# Patient Record
Sex: Female | Born: 1960 | Race: White | Hispanic: No | Marital: Married | Smoking: Never smoker
Health system: Southern US, Community
[De-identification: ages and names within clinical notes are randomized; demographics above are authoritative.]

## PROBLEM LIST (undated history)

## (undated) DIAGNOSIS — N390 Urinary tract infection, site not specified: Secondary | ICD-10-CM

## (undated) DIAGNOSIS — M1990 Unspecified osteoarthritis, unspecified site: Secondary | ICD-10-CM

## (undated) DIAGNOSIS — F419 Anxiety disorder, unspecified: Secondary | ICD-10-CM

## (undated) HISTORY — DX: Anxiety disorder, unspecified: F41.9

## (undated) HISTORY — DX: Unspecified osteoarthritis, unspecified site: M19.90

## (undated) HISTORY — DX: Urinary tract infection, site not specified: N39.0

---

## 1986-09-15 HISTORY — PX: TONSILLECTOMY: SUR1361

## 2001-06-02 ENCOUNTER — Other Ambulatory Visit: Admission: RE | Admit: 2001-06-02 | Discharge: 2001-06-02 | Payer: Self-pay | Admitting: Obstetrics and Gynecology

## 2002-06-13 ENCOUNTER — Other Ambulatory Visit: Admission: RE | Admit: 2002-06-13 | Discharge: 2002-06-13 | Payer: Self-pay | Admitting: Obstetrics and Gynecology

## 2003-06-27 ENCOUNTER — Other Ambulatory Visit: Admission: RE | Admit: 2003-06-27 | Discharge: 2003-06-27 | Payer: Self-pay | Admitting: Obstetrics and Gynecology

## 2004-06-28 ENCOUNTER — Other Ambulatory Visit: Admission: RE | Admit: 2004-06-28 | Discharge: 2004-06-28 | Payer: Self-pay | Admitting: Obstetrics and Gynecology

## 2004-09-15 DIAGNOSIS — M1990 Unspecified osteoarthritis, unspecified site: Secondary | ICD-10-CM

## 2004-09-15 HISTORY — DX: Unspecified osteoarthritis, unspecified site: M19.90

## 2005-07-04 ENCOUNTER — Other Ambulatory Visit: Admission: RE | Admit: 2005-07-04 | Discharge: 2005-07-04 | Payer: Self-pay | Admitting: Obstetrics and Gynecology

## 2005-07-17 ENCOUNTER — Encounter: Admission: RE | Admit: 2005-07-17 | Discharge: 2005-07-17 | Payer: Self-pay | Admitting: Family Medicine

## 2006-08-14 ENCOUNTER — Other Ambulatory Visit: Admission: RE | Admit: 2006-08-14 | Discharge: 2006-08-14 | Payer: Self-pay | Admitting: Obstetrics and Gynecology

## 2006-10-19 IMAGING — CR DG LUMBAR SPINE COMPLETE 4+V
5 series · 5 of 5 positions shown · non-contrast
Comparison: none

CLINICAL DATA: Low back pain.  Right hip pain. 
 LUMBAR SPINE ? 4 VIEW:

[t l-spine a.p.]
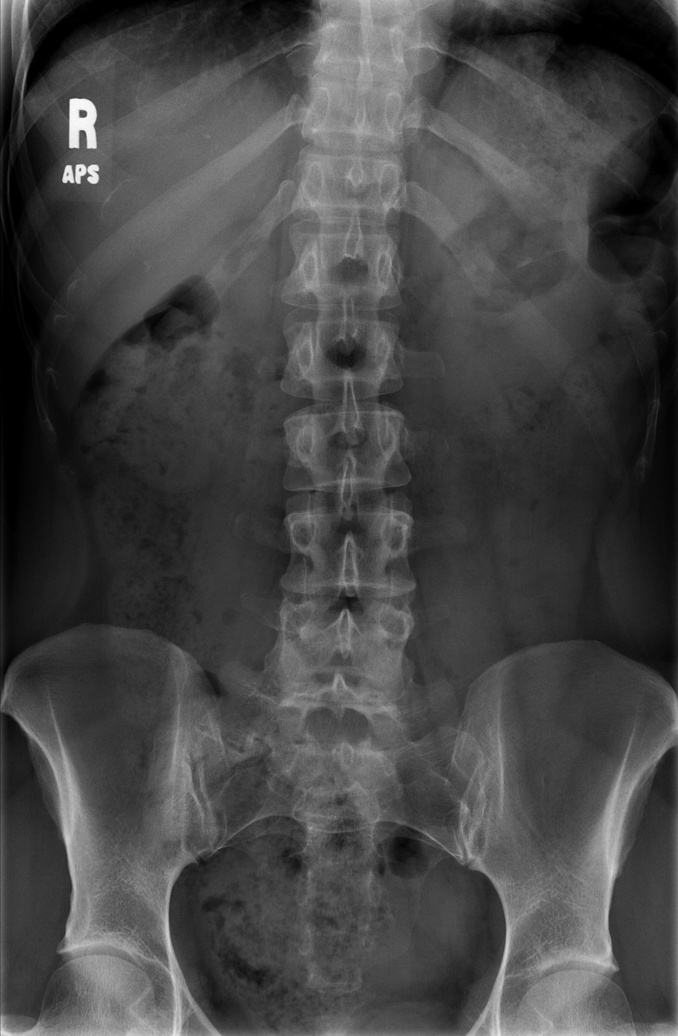

[t l-spine oblique exposure (1 of 2)]
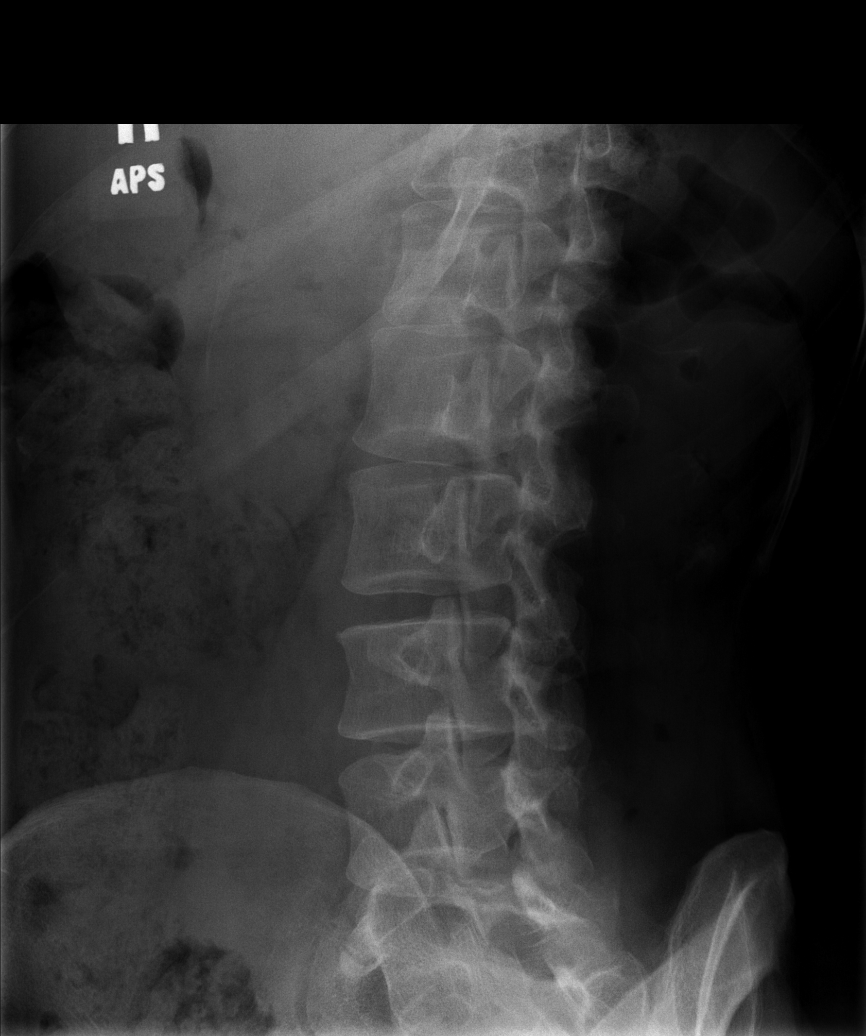

[t l-spine oblique exposure (2 of 2)]
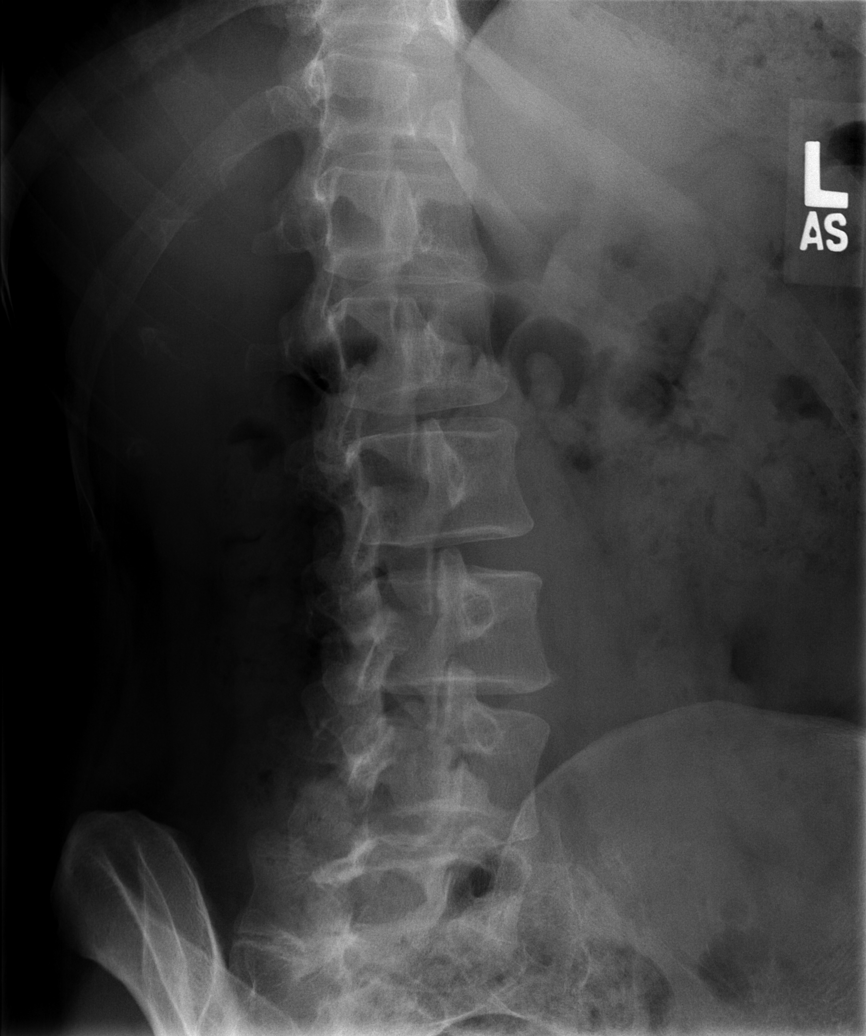

[t l-spine lat]
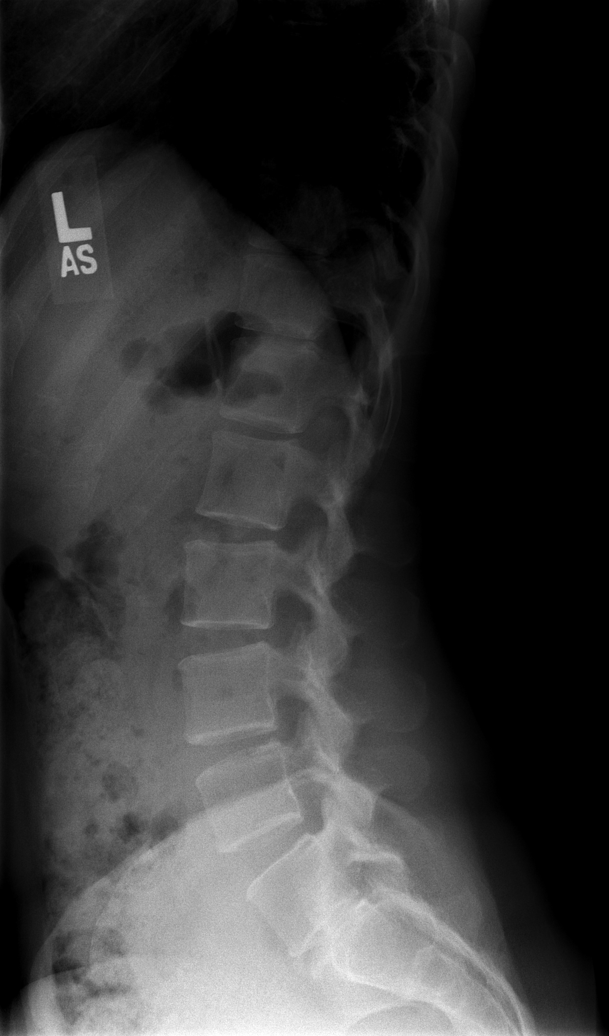

[t l-spine l5-s1 spot]
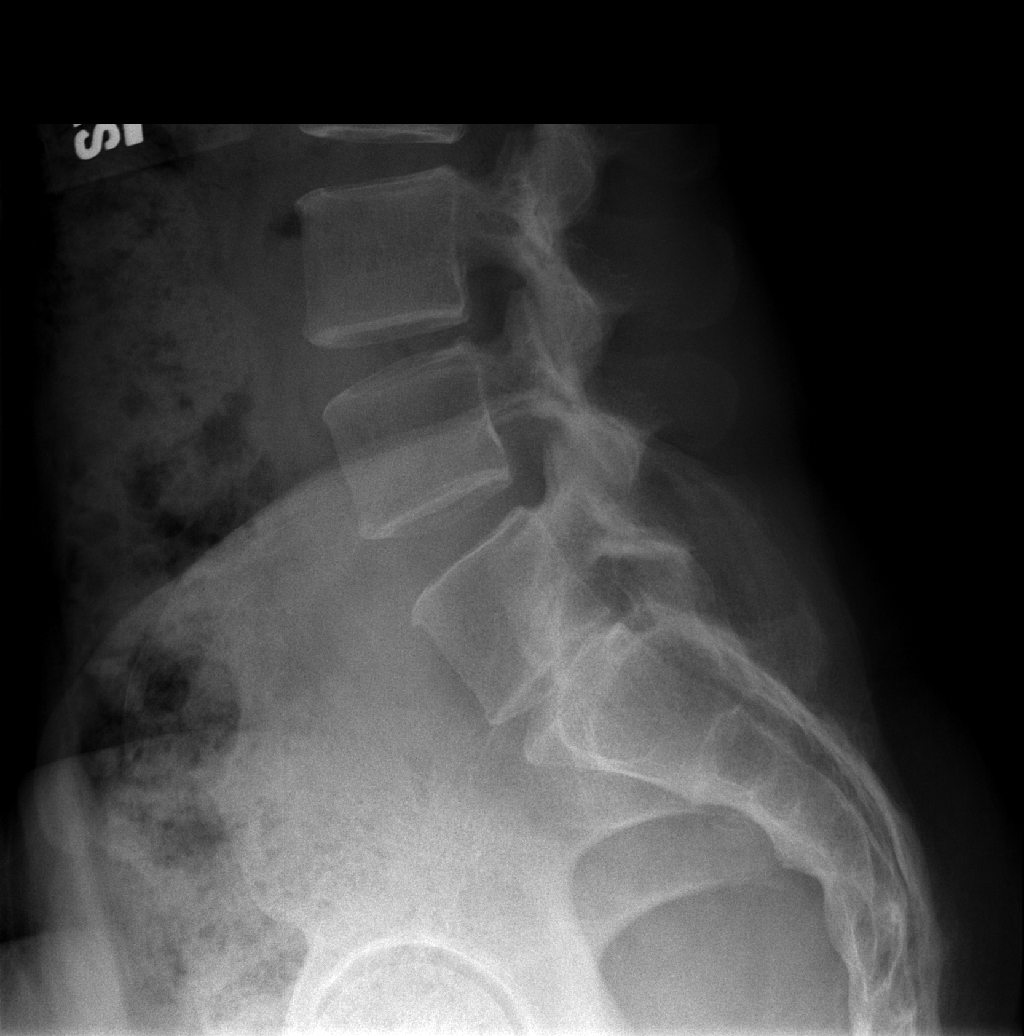

[5 of 5 positions shown; findings below may reference images not displayed]

FINDINGS: The patient has transitional anatomy.  There are six non-rib bearing-type vertebral bodies, the lowest of which is transitional.  I will describe this lowest vertebral body as L-5 but rib films would be necessary to know the actual anatomic number.  There is mild thoracolumbar scoliosis to the right and lower lumbar scoliosis to the left.  There is minimal disc space narrowing at L3-4.  The L4-5 disc space height is normal.  The L5-S1 disc space is narrowed.  This could be due to degenerative disc disease or due to the transitional nature of the anatomy.  There is facet degeneration of a mild degree at L3-4 and L4-5.  The patient has an anomalous articulation between L-5 and the sacrum on the right.  There is sacroiliac joint degenerative disease on the right.
IMPRESSION: 1.  Mild scoliosis of the spine. 
 2.  Transitional anatomy in the lumbar region.  There is slight disc space narrowing at L3-4 and possibly at L5-S1.  There is facet degeneration at L3-4 and L4-5 but no slippage.  There is degeneration of an anomalous articulation between L-5 and the sacrum and there is degeneration of the SI joint on the right.

## 2007-11-19 ENCOUNTER — Other Ambulatory Visit: Admission: RE | Admit: 2007-11-19 | Discharge: 2007-11-19 | Payer: Self-pay | Admitting: Obstetrics and Gynecology

## 2008-11-22 ENCOUNTER — Other Ambulatory Visit: Admission: RE | Admit: 2008-11-22 | Discharge: 2008-11-22 | Payer: Self-pay | Admitting: Obstetrics and Gynecology

## 2009-08-13 ENCOUNTER — Encounter: Admission: RE | Admit: 2009-08-13 | Discharge: 2009-08-13 | Payer: Self-pay | Admitting: Gastroenterology

## 2013-01-18 ENCOUNTER — Encounter: Payer: Self-pay | Admitting: Obstetrics and Gynecology

## 2013-01-18 ENCOUNTER — Encounter: Payer: Self-pay | Admitting: *Deleted

## 2013-01-18 ENCOUNTER — Ambulatory Visit (INDEPENDENT_AMBULATORY_CARE_PROVIDER_SITE_OTHER): Payer: BC Managed Care – PPO | Admitting: Obstetrics and Gynecology

## 2013-01-18 ENCOUNTER — Other Ambulatory Visit: Payer: Self-pay | Admitting: Obstetrics and Gynecology

## 2013-01-18 VITALS — BP 100/72 | Wt 138.0 lb

## 2013-01-18 DIAGNOSIS — F411 Generalized anxiety disorder: Secondary | ICD-10-CM

## 2013-01-18 MED ORDER — CLONAZEPAM 0.5 MG PO TABS: 0.5000 mg | ORAL_TABLET | Freq: Two times a day (BID) | ORAL | Status: DC | PRN

## 2013-01-18 MED ORDER — CITALOPRAM HYDROBROMIDE 20 MG PO TABS: 20.0000 mg | ORAL_TABLET | Freq: Every day | ORAL | Status: DC

## 2013-01-18 NOTE — Telephone Encounter (Signed)
Spoke with pt who was treated by Dr. Tresa Res 1 1/2 to 2 years ago for anxiety. She started taking 2 anxiety meds (cannot remember the names) for about a year, and then weaned off of them about a year ago when she was doing better. Now pt feels the anxiety is coming back. Pt nervous, shaky, not sleeping well, decreased appetite, has sweaty hands and feet. Pt reports she is going through some stress because her mom's health is bad. Pt also getting ready to take her daughter to [place] for 3 months for an internship. Pt wondering if Dr. Tresa Res would consider restarting the same meds again. Advised pt that Dr. Tresa Res would most likely want to see her again since it has been a while. Pt willing to come in today at 1:45 if necessary, but wanted to see what Dr. Tresa Res had to say. Please advise.

## 2013-01-18 NOTE — Telephone Encounter (Signed)
Patient is calling about a refill, patient says she is having some symptoms (no further details were given)

## 2013-01-18 NOTE — Telephone Encounter (Signed)
Rec ov

## 2013-01-18 NOTE — Progress Notes (Signed)
52 yo MWF G1P1 comes in today requesting therapy for her anxiety.  Pt has been treated for anxiety in the past with citalopram 20 mg qd and prn clonazepam and after AnEx last June, decided to wean off of these because she felt the stressors that caused her anxiety were resolved.  She originally started the citalopram and cloazepam in May 2012.  Also, in 2004 she was having problems with irritability and crying easily and I treated her with Lexapro 10 mg but she used it for about 6 weeks and stopped it because she preferred to handle it "spiritually".  Now patient is experiencing social stressors again with her mom's health being bad, and with her daughter getting ready to leave for an internship in [place].  Pt wants to go back on medication.  Pt not sleeping well, tearful, has cold clammy hands, difficulty concentrating, decreased appetite, feels shaky.  Pt would like to go back on citalopram.  Also will want clonaz for immediate relief.

## 2013-01-18 NOTE — Telephone Encounter (Signed)
Spoke with Lauren Proctor to advise Dr. Tresa Res would like to see her. Lauren Proctor has appt today at 1:45.

## 2013-01-18 NOTE — Addendum Note (Signed)
Addended by: Alison Murray on: 01/18/2013 02:19 PM   Modules accepted: Orders

## 2013-01-18 NOTE — Patient Instructions (Signed)
Begin citalopram daily, and use clonazepam if needed for anxiety attacks.  I'll see you again in June for your check up.  Hang in there!

## 2013-02-22 ENCOUNTER — Encounter: Payer: Self-pay | Admitting: Gynecology

## 2013-02-25 ENCOUNTER — Encounter: Payer: Self-pay | Admitting: Gynecology

## 2013-02-25 ENCOUNTER — Ambulatory Visit (INDEPENDENT_AMBULATORY_CARE_PROVIDER_SITE_OTHER): Payer: BC Managed Care – PPO | Admitting: Gynecology

## 2013-02-25 ENCOUNTER — Ambulatory Visit: Payer: Self-pay | Admitting: Obstetrics and Gynecology

## 2013-02-25 VITALS — BP 108/68 | HR 82 | Resp 16 | Ht 64.75 in | Wt 134.0 lb

## 2013-02-25 DIAGNOSIS — Z01419 Encounter for gynecological examination (general) (routine) without abnormal findings: Secondary | ICD-10-CM

## 2013-02-25 DIAGNOSIS — F411 Generalized anxiety disorder: Secondary | ICD-10-CM | POA: Insufficient documentation

## 2013-02-25 DIAGNOSIS — Z0000 Encounter for general adult medical examination without abnormal findings: Secondary | ICD-10-CM

## 2013-02-25 LAB — HEMOGLOBIN, FINGERSTICK: Hemoglobin, fingerstick: 13.2 g/dL (ref 12.0–16.0)

## 2013-02-25 MED ORDER — CLONAZEPAM 0.5 MG PO TABS: 0.5000 mg | ORAL_TABLET | Freq: Two times a day (BID) | ORAL | Status: DC | PRN

## 2013-02-25 NOTE — Patient Instructions (Signed)

## 2013-02-25 NOTE — Progress Notes (Signed)
52 y.o. Married Caucasian female   G1P1 here for annual exam. Pt reports menses are regular.  She does not report hot flashes, does not have night sweats, does have vaginal dryness.  She is not using lubricants,.  She does not report post-menopasual bleeding Pt restarted on celexa 59month ago for stress and reports is  feeling better; but her sleep is not.  Would like to continue clonazepan needs refill.  LMP: 02/23/13        Sexually active: yes  The current method of family planning is condoms.    Exercising: yes  Zumba 2X/wk; walking 4x/wk  Last pap: 02/20/12 NEG HR HPV Abnormal PAP:  Once, had cross contaminiation? Mammogram: 12/31/12  BSE: yes Colonoscopy: none DEXA: none Alcohol: no Tobacco: no  Hgb: 13.2 Urine:  Normal  Health Maintenance  Topic Date Due  . Colonoscopy  08/17/2012  . Influenza Vaccine  05/16/2013  . Mammogram  01/01/2015  . Pap Smear  02/20/2015  . Tetanus/tdap  11/18/2017    No family history on file.  There are no active problems to display for this patient.   Past Medical History  Diagnosis Date  . Anxiety     Over pelvic exams  . 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 Cesarean section  1992  . Tonsillectomy  1988    Allergies: Review of patient's allergies indicates no known allergies.  Current Outpatient Prescriptions  Medication Sig Dispense Refill  . Acetaminophen (TYLENOL PO) Take by mouth as needed.      . Calcium-Vitamin D-Vitamin K (VIACTIV) 500-500-40 MG-UNT-MCG CHEW Chew by mouth.      . Cholecalciferol (VITAMIN D PO) Take by mouth every 30 (thirty) days.       . citalopram (CELEXA) 20 MG tablet Take 1 tablet (20 mg total) by mouth daily.  30 tablet  12  . clonazePAM (KLONOPIN) 0.5 MG tablet Take 1 tablet (0.5 mg total) by mouth 2 (two) times daily as needed for anxiety.  30 tablet  1  . Multiple Vitamins-Minerals (MULTIVITAMIN PO) Take by mouth.      . Naproxen Sodium (ALEVE PO) Take  by mouth as needed.      . vitamin C (ASCORBIC ACID) 500 MG tablet Take 500 mg by mouth daily.       No current facility-administered medications for this visit.    ROS: Pertinent items are noted in HPI.  Exam:    There were no vitals taken for this visit. Weight change: @WEIGHTCHANGE @ Last 3 height recordings:  Ht Readings from Last 3 Encounters:  No data found for Ht   General appearance: alert, cooperative and appears stated age Head: Normocephalic, without obvious abnormality, atraumatic Neck: no adenopathy, no carotid bruit, no JVD, supple, symmetrical, trachea midline and thyroid not enlarged, symmetric, no tenderness/mass/nodules Lungs: clear to auscultation bilaterally Breasts: normal appearance, no masses or tenderness Heart: regular rate and rhythm, S1, S2 normal, no murmur, click, rub or gallop Abdomen: soft, non-tender; bowel sounds normal; no masses,  no organomegaly Extremities: extremities normal, atraumatic, no cyanosis or edema Skin: Skin color, texture, turgor normal. No rashes or lesions Lymph nodes: Cervical, supraclavicular, and axillary nodes normal. no inguinal nodes palpated Neurologic: Grossly normal   Pelvic: External genitalia:  no lesions              Urethra: normal appearing urethra with no masses, tenderness or lesions  Bartholins and Skenes: normal                 Vagina: normal appearing vagina with normal color and discharge, no lesions              Cervix: normal appearance              Pap taken: no        Bimanual Exam:  Uterus:  uterus is normal size, shape, consistency and nontender, retroverted                                      Adnexa:    no masses                                      Rectovaginal: Confirms                                      Anus:  normal sphincter tone, no lesions  A: well woman      P: mammogram qyr pap smear 2018 colonoscopy [facility]ine Refill clonazepam-aware habit foming Check Vit D  level return annually or prn Discussed PAP guideline changes, importance of weight bearing exercises, calcium, vit D and balanced diet.  An After Visit Summary was printed and given to the patient.

## 2013-02-28 ENCOUNTER — Telehealth: Payer: Self-pay | Admitting: *Deleted

## 2013-02-28 NOTE — Telephone Encounter (Signed)
Message copied by Lorraine Lax on Mon Feb 28, 2013 10:46 AM ------      Message from: Douglass Rivers      Created: Mon Feb 28, 2013  6:39 AM       Vit d 1, review protocol ------

## 2013-02-28 NOTE — Telephone Encounter (Signed)
Left Message To Call Back  

## 2013-02-28 NOTE — Telephone Encounter (Signed)
Pt notified see lab (vit d)

## 2013-06-21 ENCOUNTER — Encounter: Payer: Self-pay | Admitting: Certified Nurse Midwife

## 2013-06-21 ENCOUNTER — Ambulatory Visit (INDEPENDENT_AMBULATORY_CARE_PROVIDER_SITE_OTHER): Payer: BC Managed Care – PPO | Admitting: Certified Nurse Midwife

## 2013-06-21 VITALS — BP 104/64 | HR 64 | Resp 16 | Ht 64.75 in | Wt 135.0 lb

## 2013-06-21 DIAGNOSIS — B373 Candidiasis of vulva and vagina: Secondary | ICD-10-CM

## 2013-06-21 DIAGNOSIS — N390 Urinary tract infection, site not specified: Secondary | ICD-10-CM

## 2013-06-21 LAB — POCT URINALYSIS DIPSTICK
Bilirubin, UA: NEGATIVE
Blood, UA: NEGATIVE
Glucose, UA: NEGATIVE
Ketones, UA: NEGATIVE
Leukocytes, UA: NEGATIVE
Nitrite, UA: NEGATIVE
Protein, UA: NEGATIVE
Urobilinogen, UA: NEGATIVE
pH, UA: 8

## 2013-06-21 MED ORDER — PHENAZOPYRIDINE HCL 200 MG PO TABS: 200.0000 mg | ORAL_TABLET | Freq: Three times a day (TID) | ORAL | Status: DC | PRN

## 2013-06-21 MED ORDER — FLUCONAZOLE 150 MG PO TABS: 150.0000 mg | ORAL_TABLET | Freq: Once | ORAL | Status: DC

## 2013-06-21 MED ORDER — NITROFURANTOIN MONOHYD MACRO 100 MG PO CAPS: 100.0000 mg | ORAL_CAPSULE | Freq: Two times a day (BID) | ORAL | Status: DC

## 2013-06-21 NOTE — Progress Notes (Signed)
Note reviewed, agree with plan.  Janalee Grobe, MD  

## 2013-06-21 NOTE — Progress Notes (Signed)
51 y.o.MarriedCaucasian female G1P1 with a 3 day(s) history of the following:discharge described as clear with itching and burning . Sexually active: yes Last sexual activity:6days ago. Pt also reports the following associated symptoms: dysuria, urinary frequency and urinary urgency Patient has tried over the counter treatment with minimal relief. No new personal products.Patient denies fever, chills or headache.  O: Healthy female WDWN Skin warm and dry Abdomen: positive suprapubic CVAT negative bilateral    Exam:  ZOX:WRUEAV, no lesions,   Urethra, urethral meatus slightly red, tender, bladder tender                Vag:no lesions, discharge: copious, white and thick, pH 4.0, wet prep done                Cx:  normal appearance and non tender                Uterus:normal size, non-tender, normal shape and consistency                Adnexa: normal adnexa and no mass, fullness, tenderness  Wet Prep shows: positive for yeast, negative for BV, trich Poct urine-neg, ph 8.0  A: Symptomatic UTI with urethritis Yeast vaginitis  P: Reviewed findings and need to increase water daily Rx Macrobid see order Rx Pyridium See order [id number] culture Reviewed findings, encourage Aveeno sitz bath for comfort. Rx Diflucan see order :Symptomatic local care discussed.  Rv prn, TOC if indicated

## 2013-06-23 ENCOUNTER — Other Ambulatory Visit: Payer: Self-pay | Admitting: Certified Nurse Midwife

## 2013-06-23 ENCOUNTER — Telehealth: Payer: Self-pay

## 2013-06-23 DIAGNOSIS — N390 Urinary tract infection, site not specified: Secondary | ICD-10-CM

## 2013-06-23 LAB — URINE CULTURE: Colony Count: 40000

## 2013-06-23 NOTE — Telephone Encounter (Signed)
Patient notified

## 2013-06-23 NOTE — Telephone Encounter (Signed)
lmtcb

## 2013-06-23 NOTE — Telephone Encounter (Signed)
Message copied by Eliezer Bottom on Thu Jun 23, 2013  9:39 AM ------      Message from: Verner Chol      Created: Thu Jun 23, 2013  9:04 AM       Notify culture showed positive for Klebsiella in urine      Needs 2 week TOC only      Status ------

## 2013-07-07 ENCOUNTER — Telehealth: Payer: Self-pay | Admitting: Certified Nurse Midwife

## 2013-07-07 ENCOUNTER — Ambulatory Visit: Payer: BC Managed Care – PPO | Admitting: *Deleted

## 2013-07-07 NOTE — Telephone Encounter (Signed)
agree

## 2013-07-07 NOTE — Telephone Encounter (Signed)
Spoke with patient. She is having cold/flu symptoms and requested rescheduled for TOC. Appt scheduled.   Routing to provider for final review. Patient agreeable to disposition. Will close encounter

## 2013-07-07 NOTE — Telephone Encounter (Signed)
Patient had a toc urine this morning at 10:30 but wanted to cancel due to feeling sick. She said she feels ok in that area but still wanted to come in but couldn't come in until next Tuesday or thrusday i wasn't sure if it was ok for her to wait that long.

## 2013-07-12 ENCOUNTER — Ambulatory Visit (INDEPENDENT_AMBULATORY_CARE_PROVIDER_SITE_OTHER): Payer: BC Managed Care – PPO | Admitting: *Deleted

## 2013-07-12 DIAGNOSIS — N390 Urinary tract infection, site not specified: Secondary | ICD-10-CM

## 2013-07-13 LAB — URINE CULTURE
Colony Count: NO GROWTH
Organism ID, Bacteria: NO GROWTH

## 2013-07-14 ENCOUNTER — Telehealth: Payer: Self-pay | Admitting: *Deleted

## 2013-07-14 NOTE — Telephone Encounter (Signed)
Patient is returning [place] call

## 2013-07-14 NOTE — Telephone Encounter (Signed)
Message copied by Lorraine Lax on Thu Jul 14, 2013 10:06 AM ------      Message from: Verner Chol      Created: Thu Jul 14, 2013  8:15 AM       Notify urine culture negative, no further treatment needed. ------

## 2013-07-14 NOTE — Telephone Encounter (Signed)
Left Message To Call Back  

## 2013-07-19 NOTE — Telephone Encounter (Signed)
Patient notified see labs 

## 2013-10-31 ENCOUNTER — Encounter: Payer: Self-pay | Admitting: Certified Nurse Midwife

## 2013-10-31 ENCOUNTER — Ambulatory Visit (INDEPENDENT_AMBULATORY_CARE_PROVIDER_SITE_OTHER): Payer: BC Managed Care – PPO | Admitting: Certified Nurse Midwife

## 2013-10-31 VITALS — BP 98/67 | HR 81 | Temp 98.1°F | Resp 16 | Ht 64.75 in | Wt 137.0 lb

## 2013-10-31 DIAGNOSIS — B373 Candidiasis of vulva and vagina: Secondary | ICD-10-CM

## 2013-10-31 DIAGNOSIS — B3731 Acute candidiasis of vulva and vagina: Secondary | ICD-10-CM

## 2013-10-31 DIAGNOSIS — N390 Urinary tract infection, site not specified: Secondary | ICD-10-CM

## 2013-10-31 LAB — POCT URINALYSIS DIPSTICK
Bilirubin, UA: NEGATIVE
Glucose, UA: NEGATIVE
Ketones, UA: NEGATIVE
Leukocytes, UA: NEGATIVE
Nitrite, UA: NEGATIVE
Protein, UA: NEGATIVE
RBC UA: NEGATIVE
Urobilinogen, UA: NEGATIVE
pH, UA: 5

## 2013-10-31 MED ORDER — NITROFURANTOIN MONOHYD MACRO 100 MG PO CAPS: 100.0000 mg | ORAL_CAPSULE | Freq: Two times a day (BID) | ORAL | Status: DC

## 2013-10-31 MED ORDER — FLUCONAZOLE 100 MG PO TABS: ORAL_TABLET | ORAL | Status: DC

## 2013-10-31 NOTE — Progress Notes (Signed)
Reviewed personally.  M. Suzanne Gianah Batt, MD.  

## 2013-10-31 NOTE — Progress Notes (Signed)
53 y.o.MarriedCaucasian female G1P1 with a 4 day(s) history of the following:local irritation and urinary symptoms of urinary frequency and urinary urgency Sexually active:yes Last sexual activity:14 days ago. Pt also reports the following associated symptoms: none. Patient has not tried over the counter treatment..Patient recently treated with antibiotics for sinus infection one week ago.  O:Healthy WDWN female Affect: normal, non tender Skin: warm and dry CVAT: Negative  Bilateral     Exam:  AVW:[id number]:normal, no lesions  Bladder, urethra tender, urethral meatus slightly red, not tender                Vag:no lesions, discharge: white, thick and curd-like Ph 4.0, wet prep taken                Cx:  normal appearance                Uterus:normal size, non-tender, normal shape and consistency                Adnexa: normal adnexa and no mass, fullness, tenderness  Wet Prep shows:positive for yeast  Poct urine-neg  A:UTI Yeast vaginitis  P: Reviewed findings and recommend treatment. Rx Macrobid with instructions see order Reviewed yeast findings and maybe a result of antibiotic use. Rx Diflucan see order. Encouraged oral probiotic use to help with immune status response with antibiotic use.  Lab: Urine culture  Symptomatic local care discussed. Transport plannerducational materials distributed.  Rv prn

## 2013-10-31 NOTE — Patient Instructions (Signed)
Urinary Tract Infection  Urinary tract infections (UTIs) can develop anywhere along your urinary tract. Your urinary tract is your body's drainage system for removing wastes and extra water. Your urinary tract includes two kidneys, two ureters, a bladder, and a urethra. Your kidneys are a pair of bean-shaped organs. Each kidney is about the size of your fist. They are located below your ribs, one on each side of your spine.  CAUSES  Infections are caused by microbes, which are microscopic organisms, including fungi, viruses, and bacteria. These organisms are so small that they can only be seen through a microscope. Bacteria are the microbes that most commonly cause UTIs.  SYMPTOMS   Symptoms of UTIs may vary by age and gender of the patient and by the location of the infection. Symptoms in young women typically include a frequent and intense urge to urinate and a painful, burning feeling in the bladder or urethra during urination. Older women and men are more likely to be tired, shaky, and weak and have muscle aches and abdominal pain. A fever may mean the infection is in your kidneys. Other symptoms of a kidney infection include pain in your back or sides below the ribs, nausea, and vomiting.  DIAGNOSIS  To diagnose a UTI, your caregiver will ask you about your symptoms. Your caregiver also will ask to provide a urine sample. The urine sample will be tested for bacteria and white blood cells. White blood cells are made by your body to help fight infection.  TREATMENT   Typically, UTIs can be treated with medication. Because most UTIs are caused by a bacterial infection, they usually can be treated with the use of antibiotics. The choice of antibiotic and length of treatment depend on your symptoms and the type of bacteria causing your infection.  HOME CARE INSTRUCTIONS   If you were prescribed antibiotics, take them exactly as your caregiver instructs you. Finish the medication even if you feel better after you  have only taken some of the medication.   Drink enough water and fluids to keep your urine clear or pale yellow.   Avoid caffeine, tea, and carbonated beverages. They tend to irritate your bladder.   Empty your bladder often. Avoid holding urine for long periods of time.   Empty your bladder before and after sexual intercourse.   After a bowel movement, women should cleanse from front to back. Use each tissue only once.  SEEK MEDICAL CARE IF:    You have back pain.   You develop a fever.   Your symptoms do not begin to resolve within 3 days.  SEEK IMMEDIATE MEDICAL CARE IF:    You have severe back pain or lower abdominal pain.   You develop chills.   You have nausea or vomiting.   You have continued burning or discomfort with urination.  MAKE SURE YOU:    Understand these instructions.   Will watch your condition.   Will get help right away if you are not doing well or get worse.  Document Released: 06/11/2005 Document Revised: 03/02/2012 Document Reviewed: 10/10/2011  ExitCare Patient Information 2014 [employer].

## 2013-11-02 ENCOUNTER — Telehealth: Payer: Self-pay

## 2013-11-02 LAB — URINE CULTURE: Colony Count: 6000

## 2013-11-02 NOTE — Telephone Encounter (Signed)
Message copied by Eliezer BottomJOHNSON, Rebekka Lobello J on Wed Nov 02, 2013 12:22 PM ------      Message from: Verner CholLEONARD, DEBORAH S      Created: Wed Nov 02, 2013 10:55 AM       Notify patient that urine culture negative      Patient status ------

## 2013-11-02 NOTE — Telephone Encounter (Signed)
Patient notified of results.

## 2013-11-02 NOTE — Telephone Encounter (Signed)
lmtcb

## 2014-02-27 ENCOUNTER — Ambulatory Visit: Payer: BC Managed Care – PPO | Admitting: Gynecology

## 2014-02-28 ENCOUNTER — Ambulatory Visit: Payer: BC Managed Care – PPO | Admitting: Certified Nurse Midwife

## 2014-03-07 ENCOUNTER — Ambulatory Visit: Payer: BC Managed Care – PPO | Admitting: Certified Nurse Midwife

## 2014-03-15 HISTORY — PX: BIOPSY THYROID: PRO38

## 2014-03-21 ENCOUNTER — Ambulatory Visit (INDEPENDENT_AMBULATORY_CARE_PROVIDER_SITE_OTHER): Payer: BC Managed Care – PPO | Admitting: Certified Nurse Midwife

## 2014-03-21 ENCOUNTER — Encounter: Payer: Self-pay | Admitting: Certified Nurse Midwife

## 2014-03-21 VITALS — BP 102/80 | HR 64 | Resp 18 | Ht 64.75 in | Wt 136.0 lb

## 2014-03-21 DIAGNOSIS — Z01419 Encounter for gynecological examination (general) (routine) without abnormal findings: Secondary | ICD-10-CM

## 2014-03-21 DIAGNOSIS — Z124 Encounter for screening for malignant neoplasm of cervix: Secondary | ICD-10-CM

## 2014-03-21 DIAGNOSIS — Z0000 Encounter for general adult medical examination without abnormal findings: Secondary | ICD-10-CM

## 2014-03-21 DIAGNOSIS — E041 Nontoxic single thyroid nodule: Secondary | ICD-10-CM

## 2014-03-21 DIAGNOSIS — R350 Frequency of micturition: Secondary | ICD-10-CM

## 2014-03-21 LAB — LIPID PANEL
Cholesterol: 182 mg/dL (ref 0–200)
HDL: 67 mg/dL (ref >39)
LDL CALC: 98 mg/dL (ref 0–99)
TRIGLYCERIDES: 87 mg/dL (ref <150)
Total CHOL/HDL Ratio: 2.7 Ratio
VLDL: 17 mg/dL (ref 0–40)

## 2014-03-21 LAB — HEMOGLOBIN, FINGERSTICK: HEMOGLOBIN, FINGERSTICK: 13.6 g/dL (ref 12.0–16.0)

## 2014-03-21 NOTE — Patient Instructions (Signed)

## 2014-03-21 NOTE — Progress Notes (Signed)
53 y.o. G1P1 Married Caucasian Fe here for annual exam. Periods over the past year have been regular 2 day duration with increased cramps with moderate flow. [facility] problems. Patient feels urniary frequency has increased, but has history of "nervous bladder", denies urgency or pain or odor. Denies back pain, nausea or blood in urine. Patient drinks water all day long at work also. Patient also complaining of hip pain when period starts and resolves when over. Also occurs with long periods of standing, relieved with NSAID use. Sees PCP prn, no labs in past 3 years. No other health issues today.  Patient's last menstrual period was 03/04/2014.          Sexually active: Yes.    The current method of family planning is condoms Always.    Exercising: Yes.    Walking, Zumba 3 - 4 x weekly Smoker:  no  Health Maintenance: Pap: 02/2012 Neg. HR HPV Neg MMG:  01/2014 BIRADS1 category C recommended 3 D mammogram next year. Colonoscopy:  N/A BMD:   N/A TDaP:  11/19/2007 Labs:  UA: Clear Hem:13.6   reports that she has never smoked. She has never used smokeless tobacco. She reports that she does not drink alcohol or use illicit drugs.  Past Medical History  Diagnosis Date  . Anxiety     Over pelvic exams  . Arthritis 2006    Right Hip  . UTI (lower urinary tract infection)     Past Surgical History  Procedure Laterality Date  . Cesarean section  1992  . Tonsillectomy  1988    Current Outpatient Prescriptions  Medication Sig Dispense Refill  . Calcium-Vitamin D-Vitamin K (VIACTIV) 500-500-40 MG-UNT-MCG CHEW Chew by mouth.      . Multiple Vitamins-Minerals (MULTIVITAMIN PO) Take by mouth daily.       . Naproxen Sodium (ALEVE PO) Take by mouth as needed.      . vitamin C (ASCORBIC ACID) 500 MG tablet Take 500 mg by mouth daily.      . Acetaminophen (TYLENOL PO) Take by mouth as needed.       No current facility-administered medications for this visit.    History reviewed. No  pertinent family history.  ROS:  Pertinent items are noted in HPI.  Otherwise, a comprehensive ROS was negative.  Exam:   BP 102/80  Pulse 64  Resp 18  Ht 5' 4.75" (1.645 m)  Wt 136 lb (61.689 kg)  BMI 22.80 kg/m2  LMP 03/04/2014 Height: 5' 4.75" (164.5 cm)  Ht Readings from Last 3 Encounters:  03/21/14 5' 4.75" (1.645 m)  10/31/13 5' 4.75" (1.645 m)  06/21/13 5' 4.75" (1.645 m)    General appearance: alert, cooperative and appears stated age Head: Normocephalic, without obvious abnormality, atraumatic Neck: no adenopathy, supple, symmetrical, trachea midline and thyroid solitary nodule on right noted Lungs: clear to auscultation bilaterally CVAT negative bilateral Breasts: normal appearance, no masses or tenderness, No nipple retraction or dimpling, No nipple discharge or bleeding, No axillary or supraclavicular adenopathy Heart: regular rate and rhythm Abdomen: soft, non-tender; no masses,  no organomegaly, no suprapubic tenderness Extremities: extremities normal, atraumatic, no cyanosis or edema Skin: Skin color, texture, turgor normal. No rashes or lesions, warn and dry Lymph nodes: Cervical, supraclavicular, and axillary nodes normal. No abnormal inguinal nodes palpated Neurologic: Grossly normal   Pelvic: External genitalia:  no lesions              Urethra:  normal appearing urethra with no masses,  tenderness or lesions  Bladder non tender, urethral meatus non tender or red              Bartholin's and Skene's: normal                 Vagina: normal appearing vagina with normal color and discharge, no lesions              Cervix: normal, non tender              Pap taken: Yes.   Bimanual Exam:  Uterus:  normal size, contour, position, consistency, mobility, non-tender and retroverted              Adnexa: normal adnexa and no mass, fullness, tenderness               Rectovaginal: Confirms               Anus:  normal sphincter tone, no lesions  A:  Well Woman with  normal exam  Contraception condoms consistent  R/O UTI, POCT urine negative  Right thyroid nodule  ? Right hip arthritis per chart history in 2006  P:   Reviewed health and wellness pertinent to exam  Patient aware of UTI symptoms and will advise if change.  Lab: Urine culture  Discussed findings and need for evaluation with US. Patient agreeable. Will send order to schedule and patient to be contacted. Lab TSH with panel  Discussed needs to see orthopedic if hip pain continues.? Trial of Alieve for 3 days with food to see if relief which will help if joint related.  Pap smear taken today with HPVHR   counseled on breast self exam, mammography screening, adequate intake of calcium and vitamin D, diet and exercise  return annually or prn  An After Visit Summary was printed and given to the patient.

## 2014-03-22 ENCOUNTER — Telehealth: Payer: Self-pay | Admitting: Certified Nurse Midwife

## 2014-03-22 LAB — THYROID PANEL WITH TSH
Free Thyroxine Index: 2.9 (ref 1.0–3.9)
T3 Uptake: 35.5 % (ref 22.5–37.0)
T4, Total: 8.1 ug/dL (ref 5.0–12.5)
TSH: 1.89 u[IU]/mL (ref 0.350–4.500)

## 2014-03-22 LAB — VITAMIN D 25 HYDROXY (VIT D DEFICIENCY, FRACTURES): Vit D, 25-Hydroxy: 37 ng/mL (ref 30–89)

## 2014-03-22 NOTE — Telephone Encounter (Signed)
[facility] Spoke with patient. Patient states that she can do any day after tomorrow at any time. Advised would call and reschedule appointment. Appointment rescheduled with Clar[facility] for Friday at 3:00pm. Spoke with patient. Patient is agreeable to date and time.  Routing to provider for final review. Patient agreeable to disposition. Will close encounter

## 2014-03-22 NOTE — Telephone Encounter (Signed)
Patient said she was seen yesterday by debbi and was told someone would call her to schedule an appt about a thyroid nodule.

## 2014-03-23 ENCOUNTER — Other Ambulatory Visit: Payer: Self-pay

## 2014-03-23 LAB — IPS PAP TEST WITH REFLEX TO HPV

## 2014-03-23 LAB — URINE CULTURE
Colony Count: NO GROWTH
Organism ID, Bacteria: NO GROWTH

## 2014-03-24 ENCOUNTER — Ambulatory Visit
Admission: RE | Admit: 2014-03-24 | Discharge: 2014-03-24 | Disposition: A | Discharge status: Home / Self Care | Payer: BC Managed Care – HMO | Source: Ambulatory Visit | Attending: Certified Nurse Midwife | Admitting: Certified Nurse Midwife

## 2014-03-24 DIAGNOSIS — E041 Nontoxic single thyroid nodule: Secondary | ICD-10-CM

## 2014-03-24 NOTE — Progress Notes (Signed)
Reviewed personally.  M. Suzanne Naureen Benton, MD.  

## 2014-03-27 ENCOUNTER — Other Ambulatory Visit: Payer: Self-pay | Admitting: Certified Nurse Midwife

## 2014-03-27 ENCOUNTER — Telehealth: Payer: Self-pay | Admitting: Certified Nurse Midwife

## 2014-03-27 DIAGNOSIS — F411 Generalized anxiety disorder: Secondary | ICD-10-CM

## 2014-03-27 DIAGNOSIS — E041 Nontoxic single thyroid nodule: Secondary | ICD-10-CM

## 2014-03-27 MED ORDER — CITALOPRAM HYDROBROMIDE 20 MG PO TABS: 20.0000 mg | ORAL_TABLET | Freq: Every day | ORAL | Status: DC

## 2014-03-27 NOTE — Telephone Encounter (Signed)
Patient is asking for status from her previous call.

## 2014-03-27 NOTE — Telephone Encounter (Signed)
Patient said she had a ultrasound done over at [facility] on Friday and they told her we would call her with results today. Patient also wants to know if we also could call her in Clonazetam & Citalopram said she used to take them for her nerves has been off of them for a while for feels like all of her symptoms are coming back like before.

## 2014-03-27 NOTE — Telephone Encounter (Signed)
Patient did not mention the need for medication when seen, but has history of Citalopram use and from looking previous use and response, will refill x 1 and have patient follow up on status with OV. Order in

## 2014-03-27 NOTE — Telephone Encounter (Signed)
Call to patient. Advised that although test was performed on Fri 03-24-14, results are not always received and reviewed same day.  Can take up to a week to get results back (to provider and then back to patient) but will check with Debbi and see if we can get any info.  Patient also asking for refills on Citalopram and Clonazepam that was initially given to her by Dr Tresa Resomine 01-2013.  She has no more refills and has used started and stopped these twice since then. Did not mention at [facility] last week but feells like the anxiety is coming back and now she is more aware to ask for med before it gets to bad. Denies thoughts to harm self or others. Having trouble sleeping, thinking things out instead of sleeping, sweaty palms and feet, loss of appetite. Advised Citalopram is not a med that works well when not taken continuously and Clonazepam is a med we prescribe for short term use only. Debbi usually needs to address these issues personally but will send her message for review and will get recommendation.

## 2014-03-28 NOTE — Telephone Encounter (Signed)
See results note. 

## 2014-03-28 NOTE — Telephone Encounter (Signed)
Patient notified Debbi has sent RX and recommends follow up in one month. Consult scheduled for 04-24-14 so that can be assessed before runs out of RX.  Also advised of thyroid ultrasound results per Debbi instruction and recommend refer to [facility] for consult and possible biopsy. Patient agreeable and we will call her with appointment. Phone number and address to [facility] given.  Routing to [place] to schedule and will call her with appointment.

## 2014-03-28 NOTE — Telephone Encounter (Signed)
Debbi, do you have any instructions about her thyroid ultrasound before we call her.

## 2014-03-29 NOTE — Telephone Encounter (Signed)
Pt calling for referring appointment.

## 2014-03-29 NOTE — Telephone Encounter (Signed)
Spoke with patient. Advised that she is scheduled 07.22 @ 1130 with Dr Gerrit FriendsGerkin at [facility]. Patient agreeable.

## 2014-03-30 NOTE — Telephone Encounter (Signed)
Debbi, just FYI, appointment scheduled.    French Anaracy place in hold pending appointment.  Routing to provider for final review. Patient agreeable to disposition. Will close encounter

## 2014-04-05 ENCOUNTER — Ambulatory Visit (INDEPENDENT_AMBULATORY_CARE_PROVIDER_SITE_OTHER): Payer: BC Managed Care – PPO | Admitting: Surgery

## 2014-04-05 ENCOUNTER — Encounter (INDEPENDENT_AMBULATORY_CARE_PROVIDER_SITE_OTHER): Payer: Self-pay | Admitting: Surgery

## 2014-04-05 ENCOUNTER — Other Ambulatory Visit (INDEPENDENT_AMBULATORY_CARE_PROVIDER_SITE_OTHER): Payer: Self-pay

## 2014-04-05 VITALS — BP 116/76 | HR 65 | Temp 97.3°F | Ht 65.0 in | Wt 135.0 lb

## 2014-04-05 DIAGNOSIS — E041 Nontoxic single thyroid nodule: Secondary | ICD-10-CM

## 2014-04-05 DIAGNOSIS — E042 Nontoxic multinodular goiter: Secondary | ICD-10-CM

## 2014-04-05 NOTE — Progress Notes (Signed)
General Surgery [facility]  Chief Complaint  Patient presents with  . New Evaluation    thyroid nodules - referral from Leota Sauerseborah Leonard, CNM    HISTORY: Patient is a 53 year old female found on routine physical exam to have a palpable thyroid nodule by her primary care provider. Patient has no prior history of thyroid disease. She has never been on thyroid medication. She has had no prior head or neck surgery.  Thyroid function tests show a normal TSH level of 1.89. Patient underwent thyroid ultrasound on 03/24/2014. This showed a normal sized thyroid gland. The right thyroid lobe showed no nodules. The left thyroid lobe had 3 nodules measuring 7 mm, 9 mm, and 1.9 cm. Biopsy was recommended on the dominant nodule. This has not been performed yet.  Patient has a family history of thyroid disease in her mother who takes thyroid medication. There is no family history of thyroid cancer. There is no family history of other endocrine neoplasm.  Past Medical History  Diagnosis Date  . Anxiety     Over pelvic exams  . Arthritis 2006    Right Hip  . UTI (lower urinary tract infection)     Current Outpatient Prescriptions  Medication Sig Dispense Refill  . Acetaminophen (TYLENOL PO) Take by mouth as needed.      . Calcium-Vitamin D-Vitamin K (VIACTIV) 500-500-40 MG-UNT-MCG CHEW Chew by mouth.      . citalopram (CELEXA) 20 MG tablet Take 1 tablet (20 mg total) by mouth daily.  30 tablet  0  . Multiple Vitamins-Minerals (MULTIVITAMIN PO) Take by mouth daily.       . Naproxen Sodium (ALEVE PO) Take by mouth as needed.      . vitamin C (ASCORBIC ACID) 500 MG tablet Take 500 mg by mouth daily.       No current facility-administered medications for this visit.    No Known Allergies  History reviewed. No pertinent family history.  History   Social History  . Marital Status: Married    Spouse Name: N/A    Number of Children: N/A  . Years of Education: N/A   Social  History Main Topics  . Smoking status: Never Smoker   . Smokeless tobacco: Never Used  . Alcohol Use: No  . Drug Use: No  . Sexual Activity: Yes    Partners: Male    Birth Control/ Protection: Condom   Other Topics Concern  . None   Social History Narrative  . None    REVIEW OF SYSTEMS - PERTINENT POSITIVES ONLY: Denies tremor. Denies palpitation. Denies compressive symptoms. Denies any palpable findings on self-examination.  EXAM: Filed Vitals:   04/05/14 1138  BP: 116/76  Pulse: 65  Temp: 97.3 F (36.3 C)    GENERAL: well-developed, well-nourished, no acute distress HEENT: normocephalic; pupils equal and reactive; sclerae clear; dentition good; mucous membranes moist NECK:  Slight thickening of the left thyroid lobe compared to the right thyroid lobe on palpation without dominant or discrete mass; symmetric on extension; no palpable anterior or posterior cervical lymphadenopathy; no supraclavicular masses; no tenderness CHEST: clear to auscultation bilaterally without rales, rhonchi, or wheezes CARDIAC: regular rate and rhythm without significant murmur; peripheral pulses are full EXT:  non-tender without edema; no deformity NEURO: no gross focal deficits; no sign of tremor   LABORATORY RESULTS: See [facility] (CHL-Epic) for most recent results  RADIOLOGY RESULTS: See [facility] (CHL-Epic) for most recent results  IMPRESSION: Multiple left thyroid nodules, dominant  nodule 1.9 cm  PLAN: I discussed the above findings with the patient and her husband. I provided him with written literature to review at home.  I have recommended ultrasound-guided fine-needle aspiration biopsy of the dominant left thyroid nodule. We will arrange for this study. I will contact the patient with the results of her cytopathology when they are available.  If the cytopathology appears benign, then I believe the patient can be safely monitored. We would see her back in 6 months  with a repeat ultrasound at that time. Certainly if there is any evidence of atypia or malignancy, we would proceed with thyroid surgery for definitive diagnosis.  Patient and her husband understand and agree to proceed with fine-needle aspiration biopsy. We will make arrangements for this study in the near future.  Velora Heckler, MD, FACS General & Endocrine Surgery [facility]  Primary Care Physician: Leota Sauers, CNM

## 2014-04-05 NOTE — Patient Instructions (Signed)
Thyroid-Stimulating Hormone The amount of thyroid-stimulating hormone (TSH) or thyrotropin can be measured from a sample of blood. The TSH level can help diagnose thyroid gland or pituitary gland disorders, or monitor treatment of hypothyroidism and hyperthyroidism. TSH is produced by the pituitary gland, a tiny organ located below the brain. The pituitary gland is part of the body's feedback system to maintain stable levels of thyroid hormones released into the blood. Thyroid hormones help control the rate at which the body uses energy. The pituitary gland monitors the level of thyroid hormones released by the thyroid gland. The thyroid gland is a small butterfly-shaped gland that lies flat against the windpipe. If the thyroid gland does not release enough thyroid hormones, the pituitary gland detects the reduced thyroid hormone levels. The pituitary gland then makes more TSH to trigger the thyroid gland to produce more thyroid hormones. This increase in TSH is an effort to return the low thyroid hormones to normal levels. The increased TSH level is caused by the low thyroid hormone levels of an underactive thyroid (hypothyroidism). Symptoms of hypothyroidism include menstrual irregularities in women, weight gain, dry skin, constipation, cold intolerance, and fatigue. Rarely, a high TSH level can indicate a problem with the pituitary gland. A high TSH level could also occur when there is an insufficient level of thyroid hormone medication in individuals receiving that medication. If the thyroid gland releases too much thyroid hormones, the pituitary gland detects the increased thyroid hormone levels. The pituitary gland then makes less TSH to slow the thyroid gland from producing thyroid hormones. This decrease in TSH is an effort to return the increased thyroid hormones to normal levels. The decreased TSH level is caused by the excess thyroid hormone levels of an overactive thyroid gland (hyperthyroidism).  Symptoms associated with hyperthyroidism include rapid heart rate, weight loss, nervousness, hand tremors, irritated eyes, and difficulty sleeping. Rarely, a low TSH level can indicate a problem with the pituitary gland. PREPARATION FOR TEST No specific preparation is required for this blood test. A blood sample is obtained from a needle placed in a vein in your arm or from pricking the heel of an infant.  NORMAL FINDINGS  Adult: 0.5-5 milli-international Units/L (0.5-5 mIU/L)  Newborn: 3-20 milli-international Units/L (3-20 mIU/L)  Cord: 3-12 milli-international Units/L (3-12 mIU/L) Ranges for normal findings may vary among different laboratories and hospitals. You should always check with your doctor after having lab work or other tests done to discuss the meaning of your test results and whether your values are considered within normal limits. MEANING OF TEST  Your caregiver will go over the test results with you and discuss the importance and meaning of your results, as well as treatment options and the need for additional tests if necessary. OBTAINING THE TEST RESULTS It is your responsibility to obtain your test results. Ask the lab or department performing the test when and how you will get your results. Document Released: 09/26/2004 Document Revised: 11/24/2011 Document Reviewed: 08/13/2008 ExitCare Patient Information 2015 [employer]. This information is not intended to replace advice given to you by your health care provider. Make sure you discuss any questions you have with your health care provider.  

## 2014-04-05 NOTE — Progress Notes (Signed)
Thank you for seeing this patient Lauren SauersDeborah Monee Dembeck

## 2014-04-12 ENCOUNTER — Ambulatory Visit
Admission: RE | Admit: 2014-04-12 | Discharge: 2014-04-12 | Disposition: A | Discharge status: Home / Self Care | Payer: BC Managed Care – HMO | Source: Ambulatory Visit | Attending: Surgery | Admitting: Surgery

## 2014-04-12 ENCOUNTER — Other Ambulatory Visit (HOSPITAL_COMMUNITY)
Admission: RE | Admit: 2014-04-12 | Discharge: 2014-04-12 | Disposition: A | Discharge status: Home / Self Care | Payer: BC Managed Care – HMO | Source: Ambulatory Visit | Attending: Interventional Radiology | Admitting: Interventional Radiology

## 2014-04-12 DIAGNOSIS — E041 Nontoxic single thyroid nodule: Secondary | ICD-10-CM | POA: Insufficient documentation

## 2014-04-17 ENCOUNTER — Telehealth (INDEPENDENT_AMBULATORY_CARE_PROVIDER_SITE_OTHER): Payer: Self-pay

## 2014-04-17 ENCOUNTER — Other Ambulatory Visit (INDEPENDENT_AMBULATORY_CARE_PROVIDER_SITE_OTHER): Payer: Self-pay

## 2014-04-17 DIAGNOSIS — E042 Nontoxic multinodular goiter: Secondary | ICD-10-CM

## 2014-04-17 NOTE — Telephone Encounter (Signed)
Message copied by Joanette GulaSMITHEY, Charisa Twitty on Mon Apr 17, 2014 11:02 AM ------      Message from: Velora HecklerGERKIN, TODD M      Created: Mon Apr 17, 2014 10:01 AM       Biopsy shows a benign follicular nodule.            Safe to observe.  Will arrange repeat ultrasound in 6 months and see you in the office for physical exam after that study.            Velora Hecklerodd M. Gerkin, MD, [facility]      [facility]      Office: [phone]                   ------

## 2014-04-17 NOTE — Progress Notes (Signed)
Quick Note:  Biopsy shows a benign follicular nodule.  Safe to observe. Will arrange repeat ultrasound in 6 months and see you in the office for physical exam after that study.  Velora Hecklerodd M. Jahnay Lantier, MD, [facility] Office: [phone]    ______

## 2014-04-17 NOTE — Telephone Encounter (Signed)
LMOM for pt to call back to received attached msg from Dr Dewitt RotaGekin. Pt in recall and order for repeat u/s in epic.

## 2014-04-17 NOTE — Telephone Encounter (Signed)
Pt given message below. Pt verbalized understanding

## 2014-04-24 ENCOUNTER — Encounter: Payer: Self-pay | Admitting: Certified Nurse Midwife

## 2014-04-24 ENCOUNTER — Ambulatory Visit (INDEPENDENT_AMBULATORY_CARE_PROVIDER_SITE_OTHER): Payer: BC Managed Care – PPO | Admitting: Certified Nurse Midwife

## 2014-04-24 VITALS — BP 94/68 | HR 64 | Resp 16 | Ht 64.75 in | Wt 134.0 lb

## 2014-04-24 DIAGNOSIS — F411 Generalized anxiety disorder: Secondary | ICD-10-CM

## 2014-04-24 MED ORDER — CITALOPRAM HYDROBROMIDE 20 MG PO TABS: 20.0000 mg | ORAL_TABLET | Freq: Every day | ORAL | Status: DC

## 2014-04-24 NOTE — Progress Notes (Signed)
53 y.o.Married Caucasian female G1P1here for follow-up of anxiety disorder being treated with Celexa. Restarted again 2-3 months ago.   Patient taking medication as instructed AM  Denies nausea, headache or other medication side effects. Reports no crying,panic attack, fatigue or thoughts of self harm or others.  Feelings of anxiety and depression        Improved. Patient did have a "sweaty palm" attack when patient's daughter was [place]. No money exchange though. She handled by walking around and slight tearing. Sleeping at times, no insomnia, but restless sleep. Previous Klonipin use(Dr. Romine) for short time which worked. Does not need at present, but if needed will advise. Previous one Rx of 30 only in past. Eating well, no social concerns.   O:Healthy WD,WN female, appropriately dressed    Affect : Appropriate    A:1-Anxiety responding to Celexa 2-Counseling in progress as needed 3-Restless sleep  P:1- Continue medication as prescribed 2-RX Celexa see order 3-Discussed changing time of day with Celexa to pm to see if helps with sleep pattern. Patient will try and advise. Previous use of Melatonin without problems also, can use prn. 4-Instructed if thoughts of self harm or others seek immediate help 911 or emergency room.  Questions addressed  Rv 2 months if needed , prn   28 minutes spent with patient  in face to face counseling. .Marland Kitchen

## 2014-04-27 NOTE — Progress Notes (Signed)
Reviewed personally.  M. Suzanne Kelcey Wickstrom, MD.  

## 2014-05-11 ENCOUNTER — Telehealth: Payer: Self-pay | Admitting: Certified Nurse Midwife

## 2014-05-11 ENCOUNTER — Other Ambulatory Visit: Payer: Self-pay | Admitting: Obstetrics and Gynecology

## 2014-05-11 DIAGNOSIS — F419 Anxiety disorder, unspecified: Secondary | ICD-10-CM

## 2014-05-11 MED ORDER — CLONAZEPAM 0.5 MG PO TABS: 0.5000 mg | ORAL_TABLET | Freq: Two times a day (BID) | ORAL | Status: DC | PRN

## 2014-05-11 NOTE — Telephone Encounter (Signed)
Pt wants to talk with nurse . No information given.

## 2014-05-11 NOTE — Telephone Encounter (Signed)
Rx for Klonopin 0.5mg  #30 1RF [facility] with [facility] sheet.  Encounter previously closed.

## 2014-05-11 NOTE — Telephone Encounter (Signed)
I have reviewed the patient's visits to our office and I have some suggestions.  I will prescribe the Klonipin. #30 and one refill. I also am recommending that the patient see a therapist to help with anxiety and stress.  I would recommend Berniece Andreas or Allegra Lai.

## 2014-05-11 NOTE — Telephone Encounter (Signed)
Spoke with patient. Patient states that she was seen on 8/10 with Verner Chol CNM at which time they discussed her difficult time sleeping. Verner Chol CNM recommended that patient start taking her citalopram at night and drink camomile tea. Verner Chol CNM wanted her to try this for two weeks and call back to let her know how she was doing. Patient has tried this since office visit and states that she is only getting 3-4 hours of sleep a night. "It is just getting worse. I don't want to be on sleeping pills but was given klonipin in the past from Dr.Romine. Is this something she could do or is there something else I could try?" Advised would send a message regarding request and give patient a call back with further options. Patient agreeable.  Routing to Dr.Silva Cc: Verner Chol CNM

## 2014-05-11 NOTE — Telephone Encounter (Signed)
Spoke with patient. Advised of message as seen below from Dr.Silva. Patient is agreeable and verbalizes understanding. Referral placed to [facility]. Patient will call to schedule appointment at time that works well with her schedule.  Routing to Dr.Silva Cc: Verner Chol CNM   Routing to provider for final review. Patient agreeable to disposition. Will close encounter

## 2014-05-30 ENCOUNTER — Ambulatory Visit (INDEPENDENT_AMBULATORY_CARE_PROVIDER_SITE_OTHER): Payer: BC Managed Care – HMO | Admitting: Licensed Clinical Social Worker

## 2014-05-30 DIAGNOSIS — F411 Generalized anxiety disorder: Secondary | ICD-10-CM

## 2014-06-08 ENCOUNTER — Ambulatory Visit: Payer: BC Managed Care – HMO | Admitting: Licensed Clinical Social Worker

## 2014-06-13 ENCOUNTER — Telehealth: Payer: Self-pay | Admitting: Emergency Medicine

## 2014-06-13 NOTE — Telephone Encounter (Signed)
OK to refill Klonopin 0.5 mg. Sig:  One po bid prn anxiety. Dispense:  30. Refills:  Zero.  If patient requests further refills, I recommend she receive this through PCP or Psychiatry.

## 2014-06-13 NOTE — Telephone Encounter (Signed)
Received incoming call from [place], [facility] [address]. Patient was dispensed Klonopin 1.0 mg instead of 0.5 mg as ordered by Dr. Edward JollySilva.  Patient has been taking 1 mg po two times per day prn.  Annette StableBill states that there is a refill on the medication and patient is requesting to pick up refill. Asking if Dr. Edward JollySilva agreeable to have patient have 0.5 mg refill.  He will call patient when he has order from Dr. Edward JollySilva.

## 2014-06-14 NOTE — Telephone Encounter (Signed)
Called to Pharmacy, spoke with Adela GlimpseBernard.  Advised of order to [place]ard.  Repeated back x 2.  They will call patient to discuss.  Routing to provider for final review. Patient agreeable to disposition. Will close encounter

## 2014-06-20 ENCOUNTER — Ambulatory Visit (INDEPENDENT_AMBULATORY_CARE_PROVIDER_SITE_OTHER): Payer: BC Managed Care – HMO | Admitting: Licensed Clinical Social Worker

## 2014-06-20 DIAGNOSIS — F419 Anxiety disorder, unspecified: Secondary | ICD-10-CM

## 2014-07-06 ENCOUNTER — Ambulatory Visit: Payer: BC Managed Care – HMO | Admitting: Licensed Clinical Social Worker

## 2014-07-17 ENCOUNTER — Encounter: Payer: Self-pay | Admitting: Certified Nurse Midwife

## 2014-07-19 ENCOUNTER — Other Ambulatory Visit (INDEPENDENT_AMBULATORY_CARE_PROVIDER_SITE_OTHER): Payer: Self-pay

## 2014-07-19 DIAGNOSIS — E042 Nontoxic multinodular goiter: Secondary | ICD-10-CM

## 2014-07-20 ENCOUNTER — Ambulatory Visit: Payer: BC Managed Care – HMO | Admitting: Licensed Clinical Social Worker

## 2014-07-27 ENCOUNTER — Ambulatory Visit (INDEPENDENT_AMBULATORY_CARE_PROVIDER_SITE_OTHER): Payer: BC Managed Care – HMO | Admitting: Licensed Clinical Social Worker

## 2014-07-27 DIAGNOSIS — F419 Anxiety disorder, unspecified: Secondary | ICD-10-CM

## 2014-07-31 ENCOUNTER — Other Ambulatory Visit: Payer: Self-pay

## 2014-07-31 ENCOUNTER — Ambulatory Visit (INDEPENDENT_AMBULATORY_CARE_PROVIDER_SITE_OTHER): Payer: BC Managed Care – PPO | Admitting: Nurse Practitioner

## 2014-07-31 ENCOUNTER — Encounter: Payer: Self-pay | Admitting: Nurse Practitioner

## 2014-07-31 VITALS — BP 92/64 | HR 76 | Ht 64.75 in | Wt 133.0 lb

## 2014-07-31 DIAGNOSIS — F411 Generalized anxiety disorder: Secondary | ICD-10-CM

## 2014-07-31 DIAGNOSIS — B373 Candidiasis of vulva and vagina: Secondary | ICD-10-CM

## 2014-07-31 DIAGNOSIS — B3731 Acute candidiasis of vulva and vagina: Secondary | ICD-10-CM

## 2014-07-31 DIAGNOSIS — R350 Frequency of micturition: Secondary | ICD-10-CM

## 2014-07-31 LAB — POCT URINALYSIS DIPSTICK
BILIRUBIN UA: NEGATIVE
Blood, UA: NEGATIVE
Glucose, UA: NEGATIVE
Ketones, UA: NEGATIVE
LEUKOCYTES UA: NEGATIVE
NITRITE UA: NEGATIVE
PH UA: 6
Protein, UA: NEGATIVE
UROBILINOGEN UA: NEGATIVE

## 2014-07-31 MED ORDER — FLUCONAZOLE 150 MG PO TABS: 150.0000 mg | ORAL_TABLET | Freq: Once | ORAL | Status: DC

## 2014-07-31 MED ORDER — CITALOPRAM HYDROBROMIDE 20 MG PO TABS: 20.0000 mg | ORAL_TABLET | Freq: Every day | ORAL | Status: DC

## 2014-07-31 MED ORDER — CLONAZEPAM 0.5 MG PO TABS: 0.5000 mg | ORAL_TABLET | Freq: Two times a day (BID) | ORAL | Status: DC | PRN

## 2014-07-31 NOTE — Progress Notes (Signed)
53 y.o.  WM Fe G1P1 here with complaint of vaginal symptoms of itching, burning, and increase discharge. Describes discharge as white and thick. Onset of symptoms about November 2.   She completed course of antibiotics about 2.5 weeks ago for sinusitis.   Denies new personal products or vaginal dryness. Used Monistat OTC 3 day with some help of symptoms.   No STD concerns.  Urinary symptoms frequency that is slight better with increase in fluids.  Menses is irregular for this past month.   Normally has cycle for 3 days.  LMP 10/18 and then at mid cycle bled again for 3 days.    O:Healthy female WDWN Affect: normal, orientation x 3  Exam: no distress Abdomen: soft and non tender Lymph node: no enlargement or tenderness Pelvic exam: External genital: no lesions BUS: negative Vagina: white thin vaginal discharge noted. Wet prep taken Cervix: normal, non tender Uterus: normal, non tender Adnexa:normal, non tender, no masses or fullness noted   Wet Prep results: NSS: negative; KOH: + yeast; PH: 4.0   A: Yeast vaginitis following recent antibiotics for URI  R/O UTI   P: Discussed findings of yeast and etiology. Discussed Aveeno or baking soda sitz bath for comfort. Avoid moist clothes or pads for extended period of time. If working out in gym clothes or swim suits for long periods of time change underwear or bottoms of swimsuit if possible. Olive Oil use for skin protection prior to activity can be used to external skin. Rx: Diflucan 150 mg X 2  RV prn  She does ask if her pharmacy sent over a refill for anxiety med's per DL - unable to tell from this pont.  Advised we can check with them and see and route to DL if needed.

## 2014-07-31 NOTE — Patient Instructions (Signed)

## 2014-07-31 NOTE — Telephone Encounter (Signed)
Pt had an office visit with Ms. Patti and requested refills. Called pharmacy to confirm refills  [facility],  Clonazepam 0.5 mg-last refill 07/16/14 #30 X 0 (15 days worth),  Celexa 20mg -last refill 07/16/14 #30 X 0 Last AEX: 03/21/14, Last OV: 07/31/14  Please approve or deny

## 2014-08-01 ENCOUNTER — Telehealth: Payer: Self-pay | Admitting: Certified Nurse Midwife

## 2014-08-01 ENCOUNTER — Other Ambulatory Visit: Payer: Self-pay | Admitting: Certified Nurse Midwife

## 2014-08-01 NOTE — Telephone Encounter (Signed)
RX has been faxed.

## 2014-08-01 NOTE — Progress Notes (Signed)
Encounter reviewed by Dr. Brook Silva.  

## 2014-08-02 LAB — URINE CULTURE: Colony Count: >=100000

## 2014-08-15 ENCOUNTER — Ambulatory Visit (INDEPENDENT_AMBULATORY_CARE_PROVIDER_SITE_OTHER): Payer: BC Managed Care – HMO | Admitting: Licensed Clinical Social Worker

## 2014-08-15 DIAGNOSIS — F419 Anxiety disorder, unspecified: Secondary | ICD-10-CM

## 2014-08-16 NOTE — Telephone Encounter (Signed)
Message left to return call to [place] at [phone].   When patient returns call. Needs to speak with Billie RuddySally Yeakley, RN regarding need for pcp or psychiatry management of refills for Klonopin.

## 2014-08-16 NOTE — Telephone Encounter (Signed)
Return cal from patient. Advised that after last refill request received for Klonopin, Debbi reviewed this refill with Dr Edward JollySilva. Advised we generally refill this medication on a short term basis and that if she needs  this medication long term, we recommend she see PCP or psychiatry. Patient states she "has a lot of it" and "only takes it once a week."  States she is fine to get refills from another provider. patietn is aware we will no longer be able to refill this medication. Almedia Ballsracy Fast RN present in room for this call.  Routing to provider for final review. Patient agreeable to disposition. Will close encounter

## 2014-08-16 NOTE — Telephone Encounter (Signed)
-----   Message from Verner Choleborah S Leonard, CNM sent at 08/03/2014  6:06 PM EST ----- Yes please refer, because no other refills will be given ----- Message -----    From: Almedia Ballsracy Clorinda Wyble, RN    Sent: 08/03/2014  12:54 PM      To: Verner Choleborah S Leonard, CNM  Debbi,  A new rx for Klonopin was faxed on 11/17 that was signed by you, Carollee HerterShannon has a copy.  Do you want to refer to psychiatry?   ----- Message -----    From: Verner Choleborah S Leonard, CNM    Sent: 08/03/2014   8:37 AM      To: [facility] Triage Pool  I received two request for her Klonopin and Dr. Edward JollySilva refilled 06/13/14 and felt this should be renewed by Psychiatry. I agree please let patient know. No refills regarding this medication

## 2014-08-29 ENCOUNTER — Ambulatory Visit: Payer: BC Managed Care – HMO | Admitting: Licensed Clinical Social Worker

## 2014-09-18 ENCOUNTER — Ambulatory Visit
Admission: RE | Admit: 2014-09-18 | Discharge: 2014-09-18 | Disposition: A | Discharge status: Home / Self Care | Payer: BC Managed Care – PPO | Source: Ambulatory Visit | Attending: Surgery | Admitting: Surgery

## 2014-09-18 DIAGNOSIS — E042 Nontoxic multinodular goiter: Secondary | ICD-10-CM

## 2014-10-02 ENCOUNTER — Telehealth: Payer: Self-pay | Admitting: Certified Nurse Midwife

## 2014-10-02 NOTE — Telephone Encounter (Signed)
Pt sees D.Darcel BayleyLeonard. States she has been tracking her menstrual cycle for a couple of years now looking for signs of menopause. States she skipped November and December which is not normal and then had a heavier than normal cycle this month. States it has lasted 9+ days. Asks if this is the start of menopause or if there is a possible problem happening?  Best: [phone]  bf

## 2014-10-02 NOTE — Telephone Encounter (Signed)
Spoke with patient. Patient states that she did not have her cycle in November or December. Patient start cycle this month in 1/8. For the last two days she still notices spotting when she wipes. "I have been tracking my periods for a couple of years and they usually only last two days. I don't know if this is related to menopause or not." Advised patient this could be related to perimenopausal changes. Will need appointment with Verner Choleborah S. Leonard CNM for evaluation. Patient is agreeable. Appointment scheduled for 1/21 at 2:30pm with Verner Choleborah S. Leonard CNM. Patient is agreeable to date and time.  Routing to provider for final review. Patient agreeable to disposition. Will close encounter

## 2014-10-05 ENCOUNTER — Ambulatory Visit (INDEPENDENT_AMBULATORY_CARE_PROVIDER_SITE_OTHER): Payer: BLUE CROSS/BLUE SHIELD | Admitting: Certified Nurse Midwife

## 2014-10-05 ENCOUNTER — Encounter: Payer: Self-pay | Admitting: Certified Nurse Midwife

## 2014-10-05 VITALS — BP 110/72 | HR 68 | Resp 16 | Ht 64.75 in | Wt 133.0 lb

## 2014-10-05 DIAGNOSIS — N951 Menopausal and female climacteric states: Secondary | ICD-10-CM

## 2014-10-05 LAB — FOLLICLE STIMULATING HORMONE: FSH: 44.3 m[IU]/mL

## 2014-10-05 LAB — PROLACTIN: Prolactin: 18.2 ng/mL

## 2014-10-05 LAB — TSH: TSH: 2.175 u[IU]/mL (ref 0.350–4.500)

## 2014-10-05 NOTE — Progress Notes (Signed)
[age] y.o. Married Caucasian female G1P1 with LMP 09/22/14 here complaining of cycle change in past 3  Months. No menses in November and December 2015, but no bleeding.  Did home UPT both times negative. Contraception condoms. Patient then started period in 09/22/14 with normal bleeding 4-5 days and then spotting until 10/03/14. Felt like she would have period in 12/15, but no bleeding. No hot flashes or night sweats, but occasional emotional change. ? Menopause.  Thyroid nodule noted on aex, biopsy showed benign changes, still under follow up. No other health changes.   O: Healthy WN WD female   Alert oriented x3 Skin:warm and dry Abdomen: non tender, soft Pelvic exam: External genital: normal female, no lesions BUS negative Vagina: normal, normal discharge Cervix: normal appearance Uterus: normal, non tender Adnexa: normal, non tender, no masses  A:Normal Pelvic exam Perimenopausal with amenorrhea episode Recent benign thyroid biopsy, under follow up   P:Reviewed findings of normal pelvic exam and probable perimenopausal changes. Discussed etiology with questions addressed as to bleeding expectations. Menses calendar given with normal/abnormal parameters. Will advise if no bleeding in 3 month period. Discussed thyroid effect, weight and pituitary effect on cycle. Labs:TSH,FSH,Prolactin   Rv prn, aex

## 2014-10-05 NOTE — Patient Instructions (Signed)

## 2014-10-08 NOTE — Progress Notes (Signed)
Reviewed personally.  M. Suzanne Jaxon Flatt, MD.  

## 2014-12-13 ENCOUNTER — Other Ambulatory Visit: Payer: Self-pay | Admitting: Certified Nurse Midwife

## 2014-12-13 NOTE — Telephone Encounter (Signed)
Medication refill request: Celexa Last AEX:  03/21/14 DL Next AEX: not scheduled  Last MMG (if hormonal medication request): 01/16/14 BIRADS1:Neg Refill authorized: 07/31/14 #30/3R today please advise.

## 2015-03-29 ENCOUNTER — Other Ambulatory Visit: Payer: Self-pay | Admitting: Gastroenterology

## 2015-04-06 ENCOUNTER — Ambulatory Visit: Payer: BLUE CROSS/BLUE SHIELD | Admitting: Certified Nurse Midwife

## 2015-04-09 ENCOUNTER — Other Ambulatory Visit: Payer: Self-pay | Admitting: Certified Nurse Midwife

## 2015-04-09 NOTE — Telephone Encounter (Signed)
Patient called back and I scheduled her appointment for 04/10/15 at 8:30 with Ms. Debbie. Okay to send in #30 to last patient ms. Patty please advise.

## 2015-04-09 NOTE — Telephone Encounter (Addendum)
Medication refill request: Celexa 20 mg  Last AEX:  03/21/14 with DL Next AEX: ? Last MMG (if hormonal medication request): n/a Refill authorized: ?  Left Message To Call Back

## 2015-04-09 NOTE — Telephone Encounter (Signed)
LM on patient's vm that rx has been sent to pharmacy. 

## 2015-04-10 ENCOUNTER — Ambulatory Visit (INDEPENDENT_AMBULATORY_CARE_PROVIDER_SITE_OTHER): Payer: BLUE CROSS/BLUE SHIELD | Admitting: Certified Nurse Midwife

## 2015-04-10 ENCOUNTER — Encounter: Payer: Self-pay | Admitting: Certified Nurse Midwife

## 2015-04-10 VITALS — BP 98/62 | HR 68 | Resp 16 | Ht 64.75 in | Wt 131.0 lb

## 2015-04-10 DIAGNOSIS — Z0000 Encounter for general adult medical examination without abnormal findings: Secondary | ICD-10-CM

## 2015-04-10 DIAGNOSIS — F411 Generalized anxiety disorder: Secondary | ICD-10-CM

## 2015-04-10 DIAGNOSIS — Z01419 Encounter for gynecological examination (general) (routine) without abnormal findings: Secondary | ICD-10-CM

## 2015-04-10 LAB — COMPREHENSIVE METABOLIC PANEL
ALT: 14 U/L (ref 6–29)
AST: 19 U/L (ref 10–35)
Albumin: 3.9 g/dL (ref 3.6–5.1)
Alkaline Phosphatase: 48 U/L (ref 33–130)
BUN: 7 mg/dL (ref 7–25)
CO2: 26 mEq/L (ref 20–31)
Calcium: 9.1 mg/dL (ref 8.6–10.4)
Chloride: 102 mEq/L (ref 98–110)
Creat: 0.61 mg/dL (ref 0.50–1.05)
Glucose, Bld: 74 mg/dL (ref 65–99)
POTASSIUM: 4.2 meq/L (ref 3.5–5.3)
Sodium: 137 mEq/L (ref 135–146)
Total Bilirubin: 0.4 mg/dL (ref 0.2–1.2)
Total Protein: 6.1 g/dL (ref 6.1–8.1)

## 2015-04-10 LAB — POCT URINALYSIS DIPSTICK
Bilirubin, UA: NEGATIVE
Blood, UA: NEGATIVE
Glucose, UA: NEGATIVE
Ketones, UA: NEGATIVE
Leukocytes, UA: NEGATIVE
Nitrite, UA: NEGATIVE
Protein, UA: NEGATIVE
Urobilinogen, UA: NEGATIVE
pH, UA: 5

## 2015-04-10 LAB — CBC
HCT: 41.7 % (ref 36.0–46.0)
HEMOGLOBIN: 13.7 g/dL (ref 12.0–15.0)
MCH: 31.2 pg (ref 26.0–34.0)
MCHC: 32.9 g/dL (ref 30.0–36.0)
MCV: 95 fL (ref 78.0–100.0)
MPV: 10.3 fL (ref 8.6–12.4)
Platelets: 272 10*3/uL (ref 150–400)
RBC: 4.39 MIL/uL (ref 3.87–5.11)
RDW: 13.7 % (ref 11.5–15.5)
WBC: 6.8 10*3/uL (ref 4.0–10.5)

## 2015-04-10 MED ORDER — CITALOPRAM HYDROBROMIDE 20 MG PO TABS: 20.0000 mg | ORAL_TABLET | Freq: Every day | ORAL | Status: DC

## 2015-04-10 NOTE — Patient Instructions (Signed)
EXERCISE AND DIET:  We recommended that you start or continue a regular exercise program for good health. Regular exercise means any activity that makes your heart beat faster and makes you sweat.  We recommend exercising at least 30 minutes per day at least 3 days a week, preferably 4 or 5.  We also recommend a diet low in fat and sugar.  Inactivity, poor dietary choices and obesity can cause diabetes, heart attack, stroke, and kidney damage, among others.    ALCOHOL AND SMOKING:  Women should limit their alcohol intake to no more than 7 drinks/beers/glasses of wine (combined, not each!) per week. Moderation of alcohol intake to this level decreases your risk of breast cancer and liver damage. And of course, no recreational drugs are part of a healthy lifestyle.  And absolutely no smoking or even second hand smoke. Most people know smoking can cause heart and lung diseases, but did you know it also contributes to weakening of your bones? Aging of your skin?  Yellowing of your teeth and nails?  CALCIUM AND VITAMIN D:  Adequate intake of calcium and Vitamin D are recommended.  The recommendations for exact amounts of these supplements seem to change often, but generally speaking 600 mg of calcium (either carbonate or citrate) and 800 units of Vitamin D per day seems prudent. Certain women may benefit from higher intake of Vitamin D.  If you are among these women, your doctor will have told you during your visit.    PAP SMEARS:  Pap smears, to check for cervical cancer or precancers,  have traditionally been done yearly, although recent scientific advances have shown that most women can have pap smears less often.  However, every woman still should have a physical exam from her gynecologist every year. It will include a breast check, inspection of the vulva and vagina to check for abnormal growths or skin changes, a visual exam of the cervix, and then an exam to evaluate the size and shape of the uterus and  ovaries.  And after 53 years of age, a rectal exam is indicated to check for rectal cancers. We will also provide age appropriate advice regarding health maintenance, like when you should have certain vaccines, screening for sexually transmitted diseases, bone density testing, colonoscopy, mammograms, etc.   MAMMOGRAMS:  All women over 40 years old should have a yearly mammogram. Many facilities now offer a "3D" mammogram, which may cost around $50 extra out of pocket. If possible,  we recommend you accept the option to have the 3D mammogram performed.  It both reduces the number of women who will be called back for extra views which then turn out to be normal, and it is better than the routine mammogram at detecting truly abnormal areas.    COLONOSCOPY:  Colonoscopy to screen for colon cancer is recommended for all women at age 50.  We know, you hate the idea of the prep.  We agree, BUT, having colon cancer and not knowing it is worse!!  Colon cancer so often starts as a polyp that can be seen and removed at colonscopy, which can quite literally save your life!  And if your first colonoscopy is normal and you have no family history of colon cancer, most women don't have to have it again for 10 years.  Once every ten years, you can do something that may end up saving your life, right?  We will be happy to help you get it scheduled when you are ready.    Be sure to check your insurance coverage so you understand how much it will cost.  It may be covered as a preventative service at no cost, but you should check your particular policy.     Perimenopause Perimenopause is the time when your body begins to move into the menopause (no menstrual period for 12 straight months). It is a natural process. Perimenopause can begin 2-8 years before the menopause and usually lasts for 1 year after the menopause. During this time, your ovaries may or may not produce an egg. The ovaries vary in their production of estrogen and  progesterone hormones each month. This can cause irregular menstrual periods, difficulty getting pregnant, vaginal bleeding between periods, and uncomfortable symptoms. CAUSES  Irregular production of the ovarian hormones, estrogen and progesterone, and not ovulating every month.  Other causes include:  Tumor of the pituitary gland in the brain.  Medical disease that affects the ovaries.  Radiation treatment.  Chemotherapy.  Unknown causes.  Heavy smoking and excessive alcohol intake can bring on perimenopause sooner. SIGNS AND SYMPTOMS   Hot flashes.  Night sweats.  Irregular menstrual periods.  Decreased sex drive.  Vaginal dryness.  Headaches.  Mood swings.  Depression.  Memory problems.  Irritability.  Tiredness.  Weight gain.  Trouble getting pregnant.  The beginning of losing bone cells (osteoporosis).  The beginning of hardening of the arteries (atherosclerosis). DIAGNOSIS  Your health care provider will make a diagnosis by analyzing your age, menstrual history, and symptoms. He or she will do a physical exam and note any changes in your body, especially your female organs. Female hormone tests may or may not be helpful depending on the amount of female hormones you produce and when you produce them. However, other hormone tests may be helpful to rule out other problems. TREATMENT  In some cases, no treatment is needed. The decision on whether treatment is necessary during the perimenopause should be made by you and your health care provider based on how the symptoms are affecting you and your lifestyle. Various treatments are available, such as:  Treating individual symptoms with a specific medicine for that symptom.  Herbal medicines that can help specific symptoms.  Counseling.  Group therapy. HOME CARE INSTRUCTIONS   Keep track of your menstrual periods (when they occur, how heavy they are, how long between periods, and how long they last) as  well as your symptoms and when they started.  Only take over-the-counter or prescription medicines as directed by your health care provider.  Sleep and rest.  Exercise.  Eat a diet that contains calcium (good for your bones) and soy (acts like the estrogen hormone).  Do not smoke.  Avoid alcoholic beverages.  Take vitamin supplements as recommended by your health care provider. Taking vitamin E may help in certain cases.  Take calcium and vitamin D supplements to help prevent bone loss.  Group therapy is sometimes helpful.  Acupuncture may help in some cases. SEEK MEDICAL CARE IF:   You have questions about any symptoms you are having.  You need a referral to a specialist (gynecologist, psychiatrist, or psychologist). SEEK IMMEDIATE MEDICAL CARE IF:   You have vaginal bleeding.  Your period lasts longer than 8 days.  Your periods are recurring sooner than 21 days.  You have bleeding after intercourse.  You have severe depression.  You have pain when you urinate.  You have severe headaches.  You have vision problems. Document Released: 10/09/2004 Document Revised: 06/22/2013 Document Reviewed: 03/31/2013 ExitCare   Patient Information 2015 [employer]. This information is not intended to replace advice given to you by your health care provider. Make sure you discuss any questions you have with your health care provider.  

## 2015-04-10 NOTE — Progress Notes (Signed)
54 y.o. G1P1 Married  Caucasian Fe here for annual exam. Perimenopausal with cycle changes. Has not missed any cycles in the past 3  Months, but duration has increased in 2 cycles with lasting  5 to 6 days and slightly heavier, no issues Keeping menses calendar. Occasional hot flashes, no night sweats. Celexa is working well.  Sees PCP prn and saw 6/16 for scalp pain and was thought to be nerve pain related to neck. Did PT which ? Helped. Has stopped PT now. Plans to see dermatology if occurs again. No other health issues today.  Patient's last menstrual period was 03/03/2015.          Sexually active: Yes.    The current method of family planning is condoms all the time.    Exercising: Yes.    zumba & walking Smoker:  no  Health Maintenance: Pap: 03-21-14 neg MMG: 02-27-15 category c density, birads 2:neg 3 D yearly Colonoscopy: 03-28-15, polyp f/u 35yrs BMD:   none TDaP:  2009 Labs: Poct urine-neg, Hgb-13.9 Self breast exam: done monthly   reports that she has never smoked. She has never used smokeless tobacco. She reports that she does not drink alcohol or use illicit drugs.  Past Medical History  Diagnosis Date  . Anxiety     Over pelvic exams  . Arthritis 2006    Right Hip  . UTI (lower urinary tract infection)     Past Surgical History  Procedure Laterality Date  . Cesarean section  1992  . Tonsillectomy  1988  . Biopsy thyroid Left 03/2014    Current Outpatient Prescriptions  Medication Sig Dispense Refill  . Acetaminophen (TYLENOL PO) Take by mouth as needed.    . Calcium-Vitamin D-Vitamin K (VIACTIV) 500-500-40 MG-UNT-MCG CHEW Chew by mouth.    . citalopram (CELEXA) 20 MG tablet TAKE 1 TABLET BY MOUTH EVERY DAY 30 tablet 0  . Multiple Vitamins-Minerals (MULTIVITAMIN PO) Take by mouth daily.     . Naproxen Sodium (ALEVE PO) Take by mouth as needed.    . vitamin C (ASCORBIC ACID) 500 MG tablet Take 500 mg by mouth daily.     No current facility-administered medications  for this visit.    History reviewed. No pertinent family history.  ROS:  Pertinent items are noted in HPI.  Otherwise, a comprehensive ROS was negative.  Exam:   BP 98/62 mmHg  Pulse 68  Resp 16  Ht 5' 4.75" (1.645 m)  Wt 131 lb (59.421 kg)  BMI 21.96 kg/m2  LMP 03/03/2015 Height: 5' 4.75" (164.5 cm) Ht Readings from Last 3 Encounters:  04/10/15 5' 4.75" (1.645 m)  10/05/14 5' 4.75" (1.645 m)  07/31/14 5' 4.75" (1.645 m)    General appearance: alert, cooperative and appears stated age Head: Normocephalic, without obvious abnormality, atraumatic Neck: no adenopathy, supple, symmetrical, trachea midline and thyroid normal to inspection and palpation Lungs: clear to auscultation bilaterally Breasts: normal appearance, no masses or tenderness, No nipple retraction or dimpling, No nipple discharge or bleeding, No axillary or supraclavicular adenopathy Heart: regular rate and rhythm Abdomen: soft, non-tender; no masses,  no organomegaly Extremities: extremities normal, atraumatic, no cyanosis or edema Skin: Skin color, texture, turgor normal. No rashes or lesions Lymph nodes: Cervical, supraclavicular, and axillary nodes normal. No abnormal inguinal nodes palpated Neurologic: Grossly normal   Pelvic: External genitalia:  no lesions              Urethra:  normal appearing urethra with no masses, tenderness or lesions  Bartholin's and Skene's: normal                 Vagina: normal appearing vagina with normal color and discharge, no lesions              Cervix: normal,non tender, no lesions              Pap taken: No. Bimanual Exam:  Uterus:  normal, non tender, retroverted              Adnexa: normal adnexa and no mass, fullness, tenderness               Rectovaginal: Confirms               Anus:  Normal appearance, hemoroid, deferred due to recent colonoscopy.  Chaperone present: Yes  A:  Well Woman with normal exam  Perimenopausal with cycle change  Anxiety  Celexa working well desires continuance at present  Screening labs  P:   Reviewed health and wellness pertinent to exam  Continue menstrual calendar and advise if abnormal parameters occur. Patient agreeable.  Rx Celexa see order, discussed Celexa will sometimes also help with menopausal symptoms  Labs CMP, CBC, Vit. D  Pap smear Not taken   counseled on breast self exam, mammography screening, menopause, adequate intake of calcium and vitamin D, diet and exercise  return annually or prn  An After Visit Summary was printed and given to the patient.

## 2015-04-11 LAB — VITAMIN D 25 HYDROXY (VIT D DEFICIENCY, FRACTURES): VIT D 25 HYDROXY: 27 ng/mL — AB (ref 30–100)

## 2015-04-12 NOTE — Progress Notes (Signed)
Reviewed personally.  M. Suzanne Judit Awad, MD.  

## 2015-04-16 LAB — HEMOGLOBIN, FINGERSTICK: Hemoglobin, fingerstick: 13.9 g/dL (ref 12.0–16.0)

## 2015-05-06 ENCOUNTER — Other Ambulatory Visit: Payer: Self-pay | Admitting: Nurse Practitioner

## 2015-05-07 NOTE — Telephone Encounter (Signed)
04/10/15 #30/12 rfs was sent to [facility] - rx denied.

## 2015-05-08 ENCOUNTER — Other Ambulatory Visit: Payer: Self-pay | Admitting: Nurse Practitioner

## 2015-05-08 NOTE — Telephone Encounter (Signed)
04/10/15 #30/12 rfs sent to [facility]- rx denied.

## 2015-06-25 ENCOUNTER — Ambulatory Visit (INDEPENDENT_AMBULATORY_CARE_PROVIDER_SITE_OTHER): Payer: BLUE CROSS/BLUE SHIELD | Admitting: Nurse Practitioner

## 2015-06-25 ENCOUNTER — Encounter: Payer: Self-pay | Admitting: Nurse Practitioner

## 2015-06-25 VITALS — BP 116/74 | HR 72 | Temp 98.1°F | Ht 64.75 in | Wt 139.0 lb

## 2015-06-25 DIAGNOSIS — R350 Frequency of micturition: Secondary | ICD-10-CM | POA: Diagnosis not present

## 2015-06-25 LAB — POCT URINALYSIS DIPSTICK
BILIRUBIN UA: NEGATIVE
Blood, UA: NEGATIVE
Glucose, UA: NEGATIVE
Ketones, UA: NEGATIVE
LEUKOCYTES UA: NEGATIVE
Nitrite, UA: NEGATIVE
PH UA: 6.5
Protein, UA: NEGATIVE
Urobilinogen, UA: NEGATIVE

## 2015-06-25 MED ORDER — FLUCONAZOLE 150 MG PO TABS
150.0000 mg | ORAL_TABLET | Freq: Once | ORAL | Status: DC
Start: 2015-06-25 — End: 2016-01-07

## 2015-06-25 MED ORDER — NITROFURANTOIN MONOHYD MACRO 100 MG PO CAPS: 100.0000 mg | ORAL_CAPSULE | Freq: Two times a day (BID) | ORAL | Status: DC

## 2015-06-25 NOTE — Patient Instructions (Signed)

## 2015-06-25 NOTE — Progress Notes (Signed)
S:  54 y.o.Married white G1P1 female presents with complaint of UTI. Symptoms began with low back pain. With symptoms of back pain, urinary frequency, urinary urgency.  Pertinent negatives include The patient is having no constitutional symptoms, denying fever, chills, anorexia, or weight loss.. Sexually active yes  Symptoms related to post coital No Current method of birth control condoms. Perimenopausal with normal menses is June, amenorrhea in July and only light spotting in August for 2 days.  Since then amenorrhea.  Feels that some symptoms are related to not having a menses in that she feels more bloated.  Same partner without change. Last UTI documented a year ago. No history of renal calculi.     ROS: no weight loss, fever, night sweats and feels well  O alert, oriented to person, place, and time, normal mood, behavior, speech, dress, motor activity, and thought processes   healthy,  alert,  not in acute distress, well developed and well nourished  mild  No CVA tenderness  deferred   Diagnostic Test:    Urinalysis normal   PROCEDURES URINE C&S/ Micro  Assessment:  R/O UTI   Plan:  Maintain adequate hydration. Follow up if symptoms not improving, and as needed.   Medication Therapy: Macrobid 100 mg BID   Rx: Diflucan if needed on hand   Lab: Urine Culture and micro    RV

## 2015-06-26 ENCOUNTER — Ambulatory Visit: Payer: BLUE CROSS/BLUE SHIELD | Admitting: Obstetrics and Gynecology

## 2015-06-26 LAB — URINALYSIS, MICROSCOPIC ONLY
Bacteria, UA: NONE SEEN [HPF]
CRYSTALS: NONE SEEN [HPF]
Casts: NONE SEEN [LPF]
RBC / HPF: NONE SEEN RBC/HPF (ref <=2)
Squamous Epithelial / LPF: NONE SEEN [HPF] (ref <=5)
WBC, UA: NONE SEEN WBC/HPF (ref <=5)
YEAST: NONE SEEN [HPF]

## 2015-06-26 LAB — URINE CULTURE
Colony Count: NO GROWTH
ORGANISM ID, BACTERIA: NO GROWTH

## 2015-06-26 IMAGING — US US SOFT TISSUE HEAD/NECK
1 series · 14 of 25 positions shown · non-contrast
Comparison: None.

CLINICAL DATA: Evaluate for a right thyroid nodule.

EXAM:
THYROID ULTRASOUND
TECHNIQUE: Ultrasound examination of the thyroid gland and adjacent soft
tissues was performed.

[Series 1: us soft tissue head/neck · 0.07mm/px · 14 of 53 slices shown]
[im 1/53]
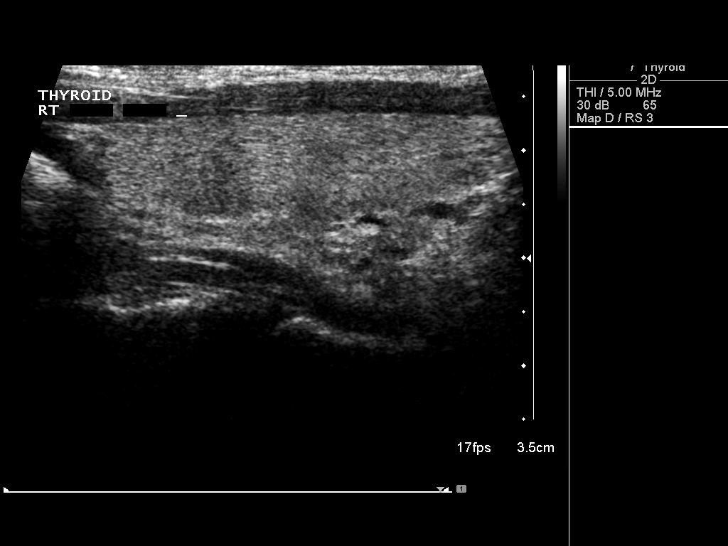
[im 5/53]
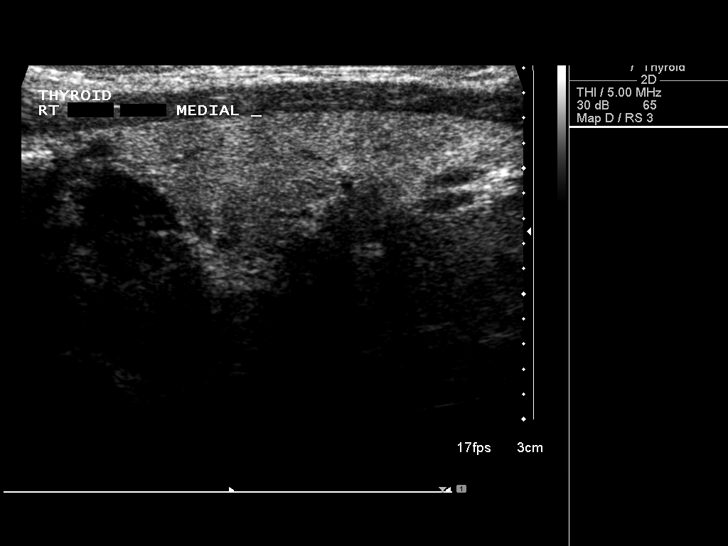
[im 9/53]
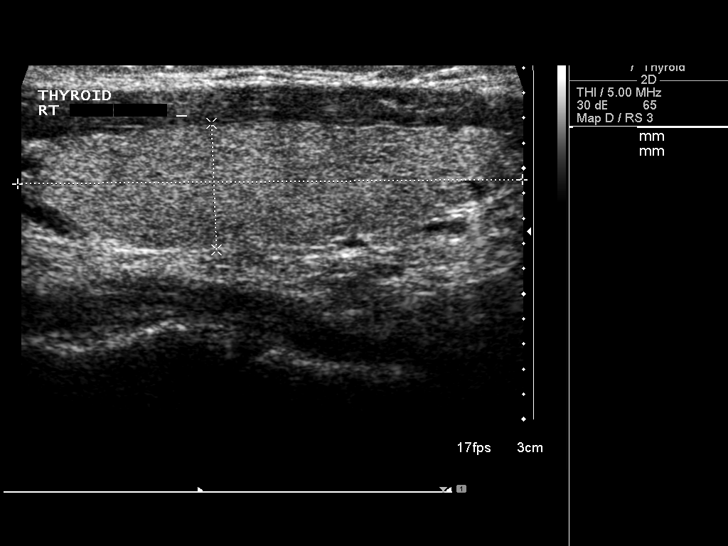
[im 14/53]
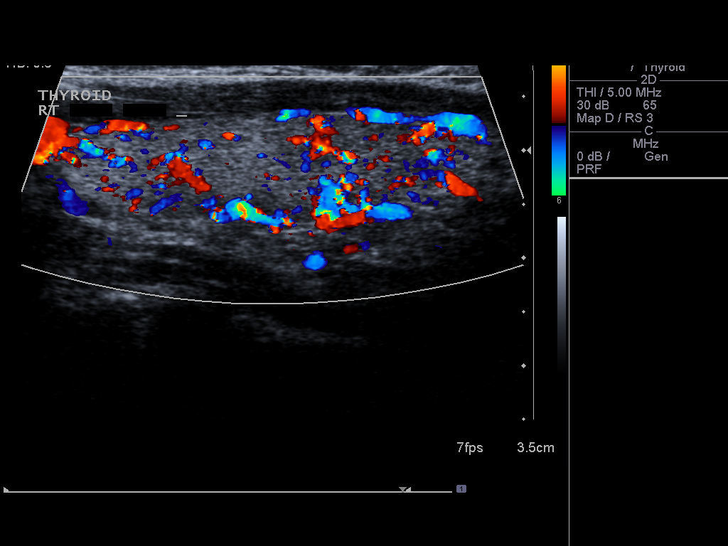
[im 18/53]
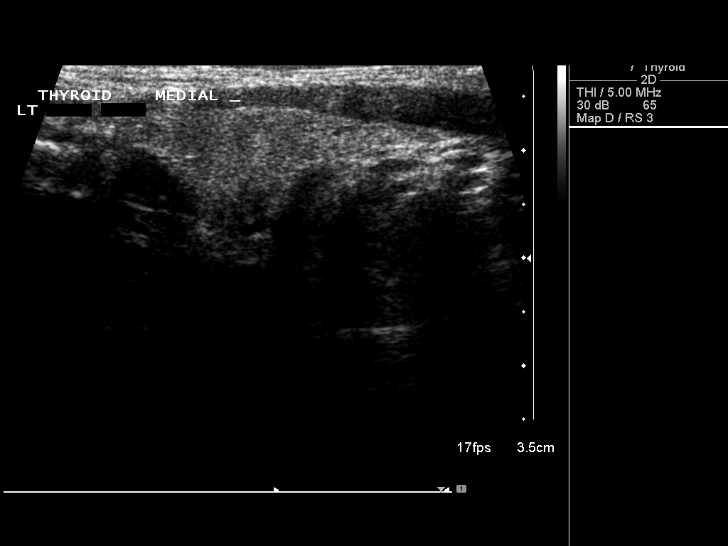
[im 20/53]
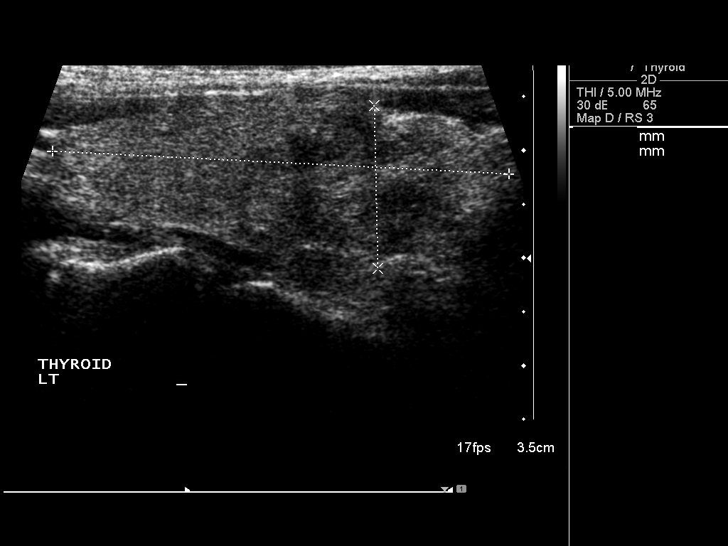
[im 24/53]
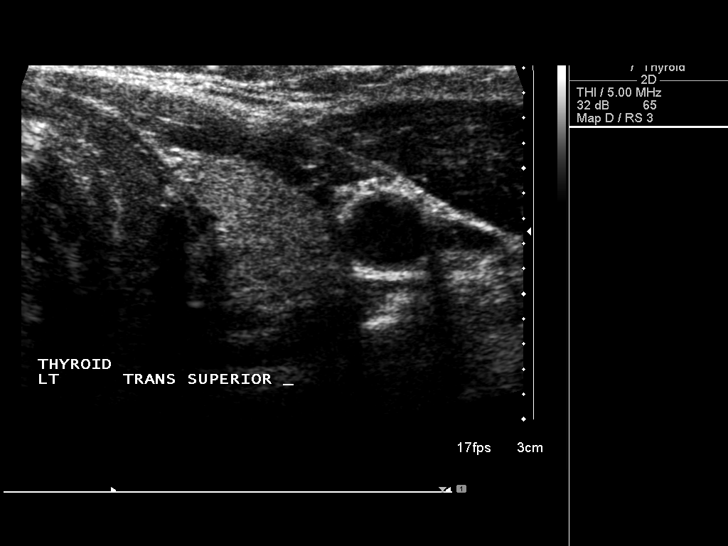
[im 29/53]
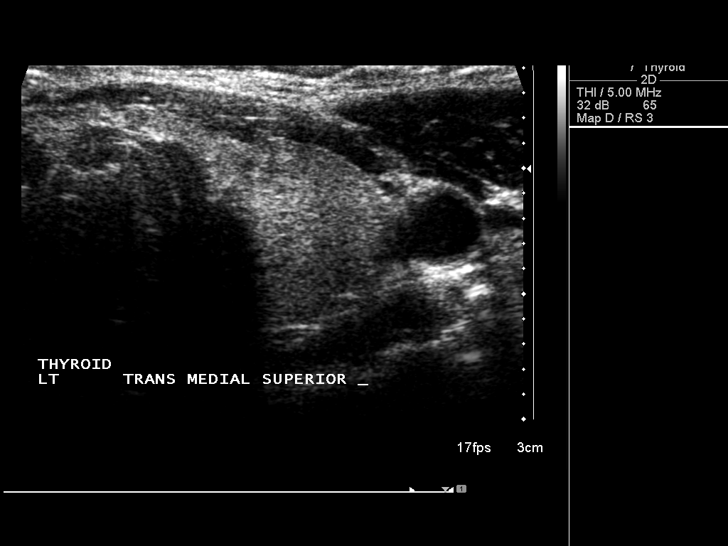
[im 33/53]
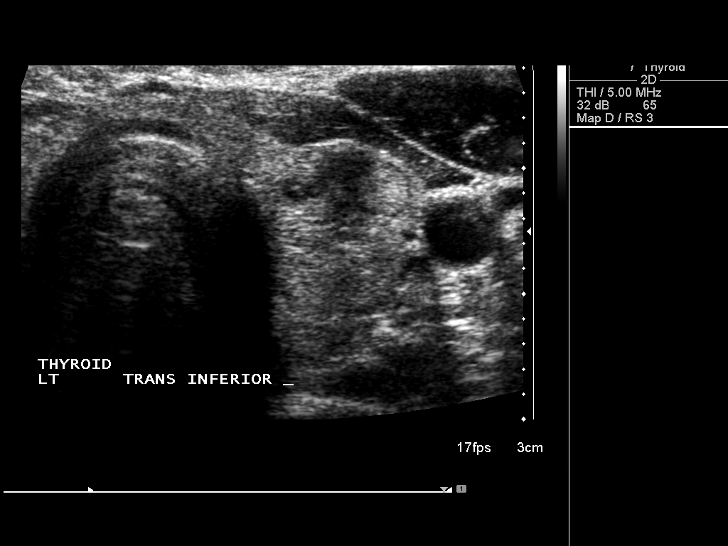
[im 35/53]
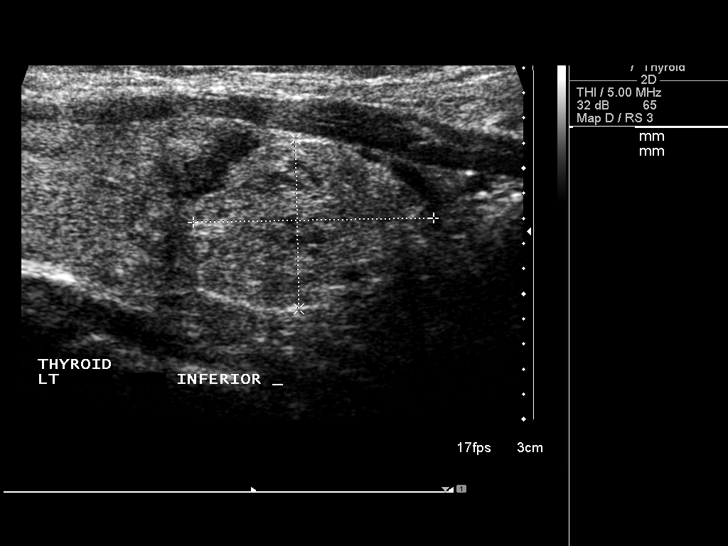
[im 40/53]
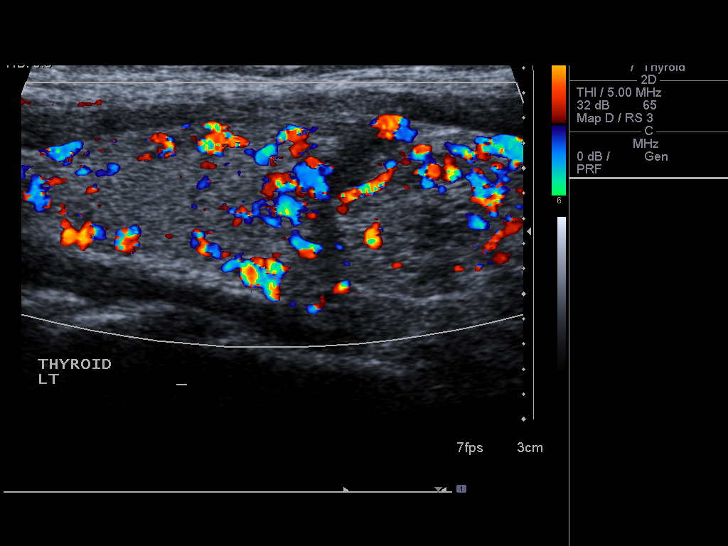
[im 44/53]
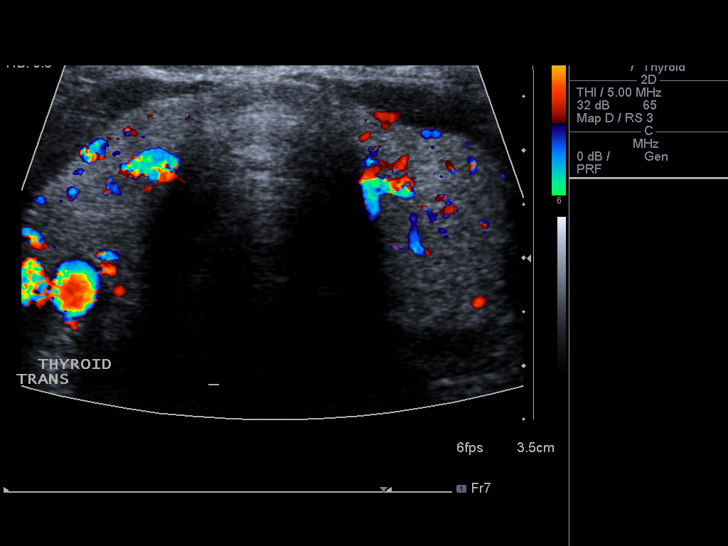
[im 48/53]
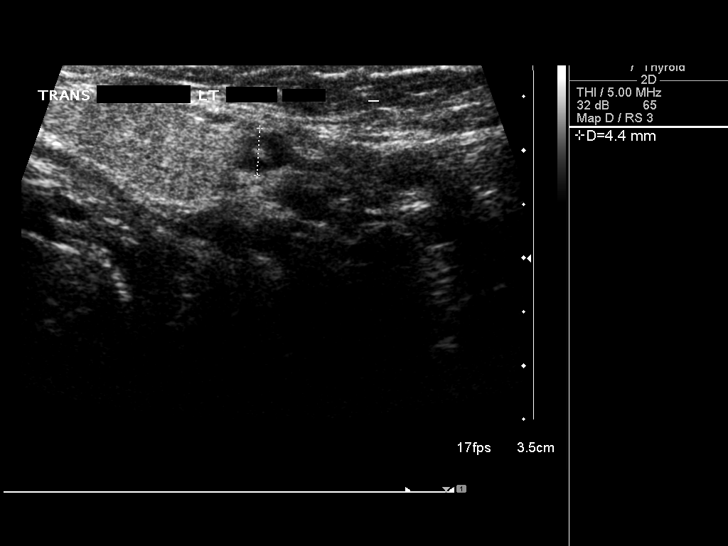
[im 53/53]
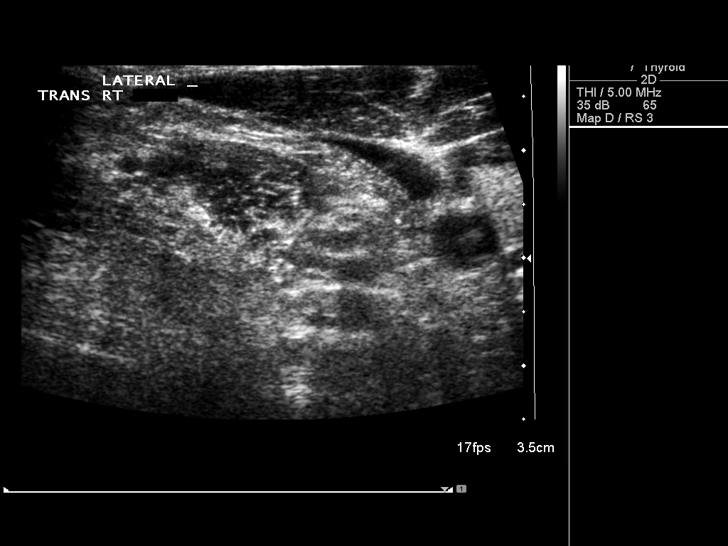

[14 of 25 positions shown; findings below may reference images not displayed]

FINDINGS: Right thyroid lobe

Measurements: 4.0 x 1.0 x 1.5 cm.  No nodules visualized.

Left thyroid lobe

Measurements: 4.2 x 1.8 x 1.3 cm. There is an isoechoic nodule in
the superior left thyroid lobe that measures 0.7 x 0.4 x 0.6 cm.
There is a heterogeneous nodule in the mid left thyroid lobe that
measures 0.9 x 0.7 x 1.1 cm. There is some acoustic shadowing
associated with this heterogeneous nodule. There is a dominant
heterogeneous nodule in the inferior left thyroid lobe that measures
1.9 x 1.4 x 1.1 cm.

Isthmus

Thickness: 0.2 cm.  No nodules visualized.

Lymphadenopathy

None visualized.
IMPRESSION: Left thyroid nodules. The dominant left thyroid nodule measures up
to 1.9 cm and meets the criteria for ultrasound-guided biopsy. This
recommendation follows the consensus statement: Management of
Thyroid Nodules Detected at US: Society of Radiologists in
Ultrasound Consensus Conference Statement. Radiology 3001;

## 2015-06-27 NOTE — Progress Notes (Signed)
Encounter reviewed by Dr. Brook Amundson C. Silva.  

## 2015-06-29 ENCOUNTER — Telehealth: Payer: Self-pay | Admitting: Nurse Practitioner

## 2015-06-29 NOTE — Telephone Encounter (Signed)
I think she needs OV to evaluate UTI

## 2015-06-29 NOTE — Telephone Encounter (Addendum)
Patient was last seen 06/25/15 and is still having some pain. Patient said she was told she may need a different prescription.

## 2015-06-29 NOTE — Telephone Encounter (Signed)
Spoke with patient. She states she has discontinued Macrobid. She states she feels improved but still continues to have left lower back pain. Contributes this to L ovary pain, denies any possible musculoskeletal concerns. She has not started her cycle. She states she is concerned about 8 lb weight gain in 3 months that she has not had a cycle. Advise will send message to Ria CommentPatricia Grubb, FNP and Leota Sauerseborah Leonard CNM for review and recommendations.

## 2015-06-29 NOTE — Telephone Encounter (Signed)
Recent negative urine culture and negative urine micro.  [facility] [phone] advised to call back on Monday when office opens or if pain increases or any concerns over the weekend to call doctor on call or seek care at urgent care or emergency room.

## 2015-06-29 NOTE — Telephone Encounter (Signed)
Entered by Ria CommentPatricia Grubb, FNP at 06/27/2015 8:34 AM   Jasmine DecemberSharon, The urine culture and micro urine was negative. You may stop the antibiotics and still increase fluids. If these symptoms were from urethritis then 3 days of the antibiotic would have helped. If you have not started your period by Friday also let me know about that. We may go ahead and give you medication to start your menses.

## 2015-07-02 MED ORDER — MEDROXYPROGESTERONE ACETATE 10 MG PO TABS: 10.0000 mg | ORAL_TABLET | Freq: Every day | ORAL | Status: DC

## 2015-07-02 NOTE — Telephone Encounter (Signed)
Message left to return call to Braelee Herrle at [phone].    

## 2015-07-02 NOTE — Telephone Encounter (Signed)
She may start on Provera 10 mg daily for 10 days and then call with +/- results of withdrawal in about 2 wks after she completes therapy.

## 2015-07-02 NOTE — Telephone Encounter (Signed)
Patient returned call. She states she feels much improved after the weekend. Declines office visit.   Has not started cycle. Requests to start Provera.  Routing to Ria CommentPatricia Grubb, FNP.

## 2015-07-02 NOTE — Telephone Encounter (Signed)
Patient returned call. She is given message from Lauren CommentPatricia Grubb, FNP and verbalized understanding. She is advised to call us back regarding either bleeding or no bleeding after completion of Provera. She is also advised to return call if pain returns or heavy bleeding.  Patient verbalized understanding of instructions and will follow up as directed.   Routing to provider for final review. Patient agreeable to disposition. Will close encounter.

## 2015-07-15 IMAGING — US US THYROID BIOPSY
1 series · 14 of 16 positions shown · non-contrast
Comparison: None.

CLINICAL DATA: Dominant left thyroid nodule

EXAM:
ULTRASOUND GUIDED NEEDLE ASPIRATE BIOPSY OF THE THYROID GLAND

[Series 1: us thyroid biopsy · 0.05mm/px · 16 acquisitions, 14 frames shown]
[im 1/16]
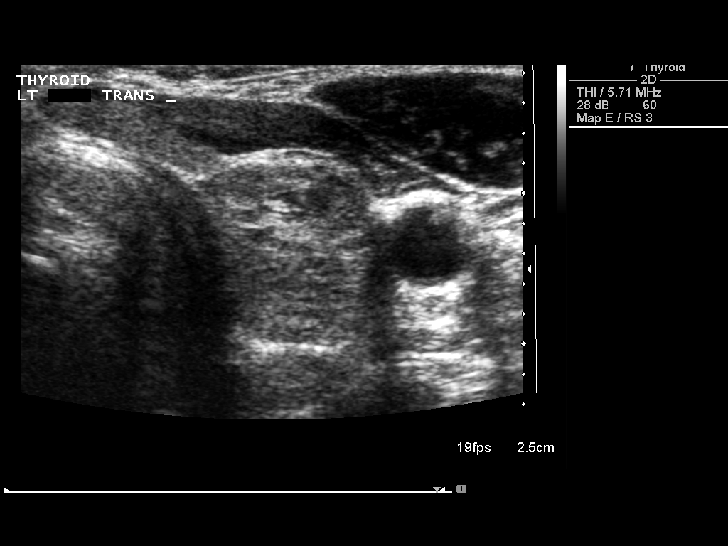
[im 2/16]
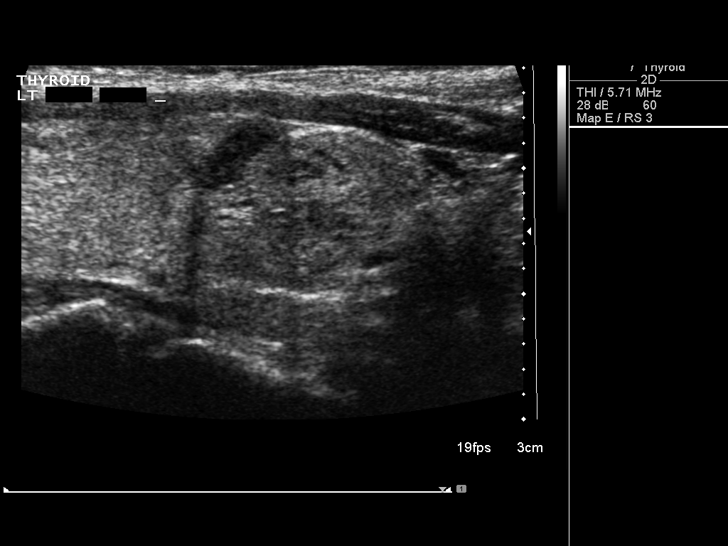
[im 3/16]
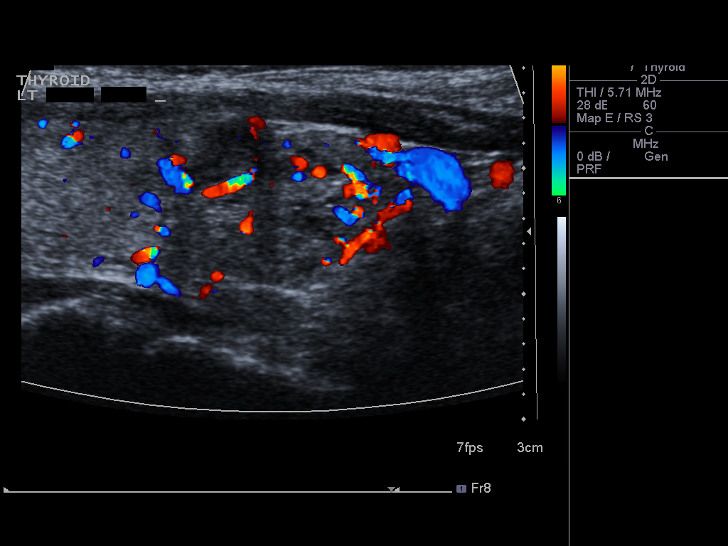
[im 5/16]
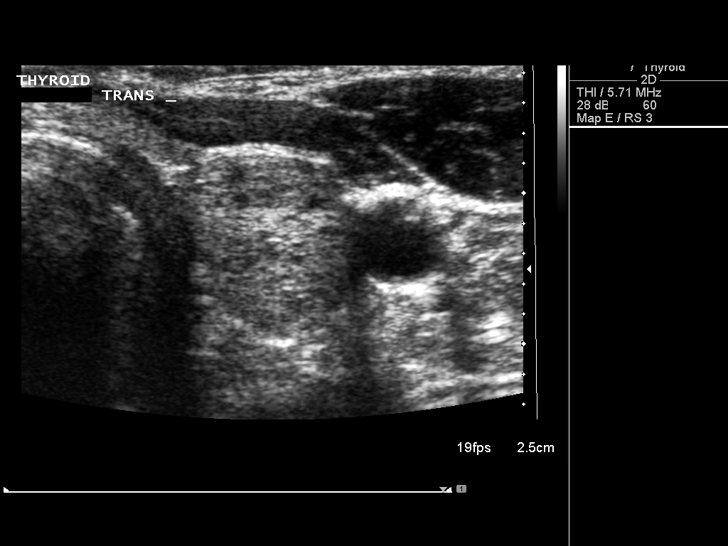
[im 6/16]
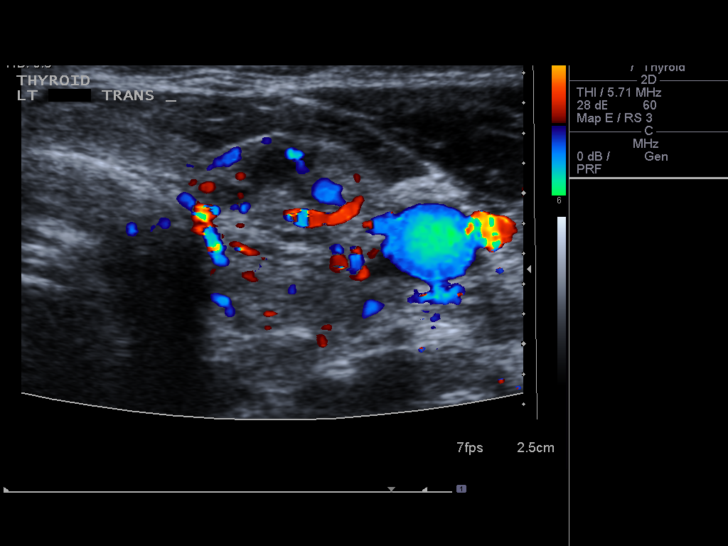
[im 7/16]
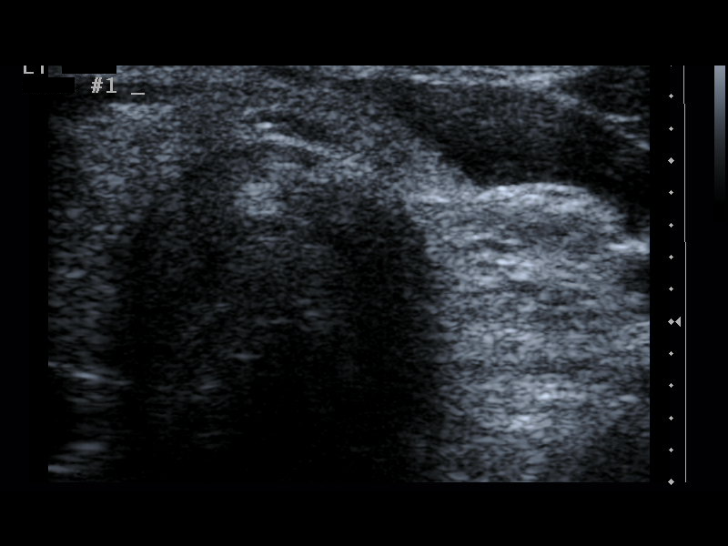
[im 8/16]
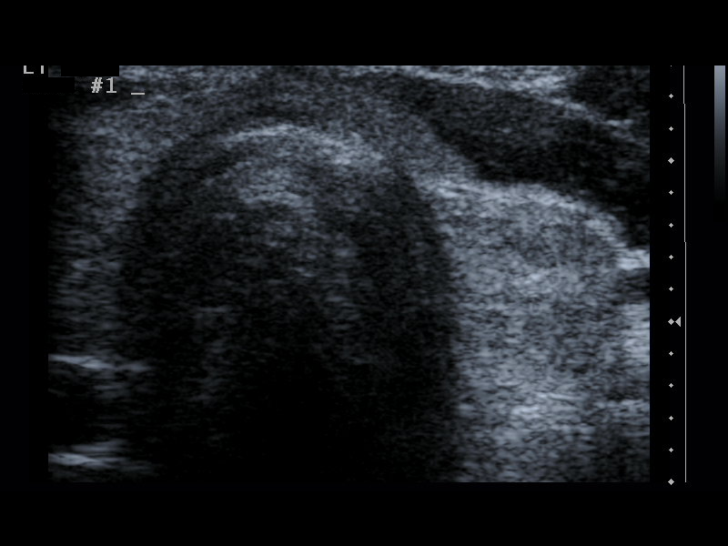
[im 9/16]
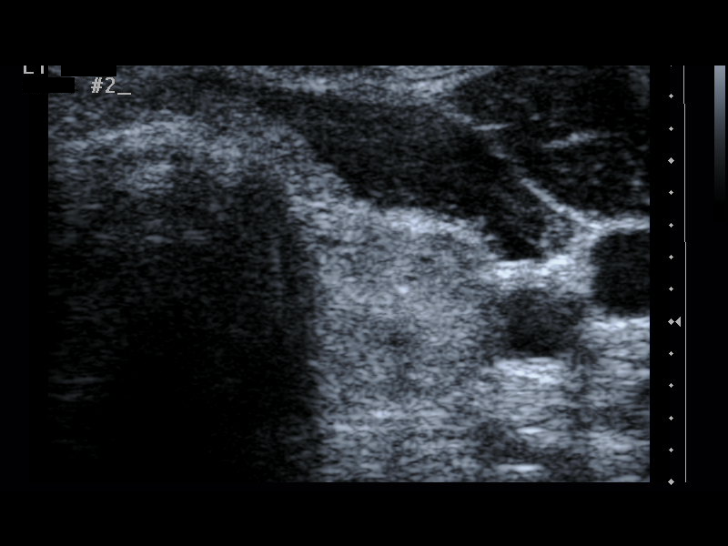
[im 10/16]
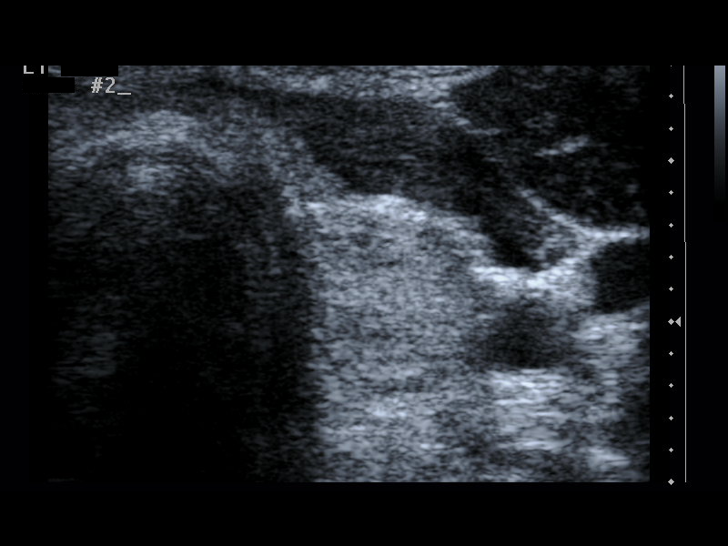
[im 11/16]
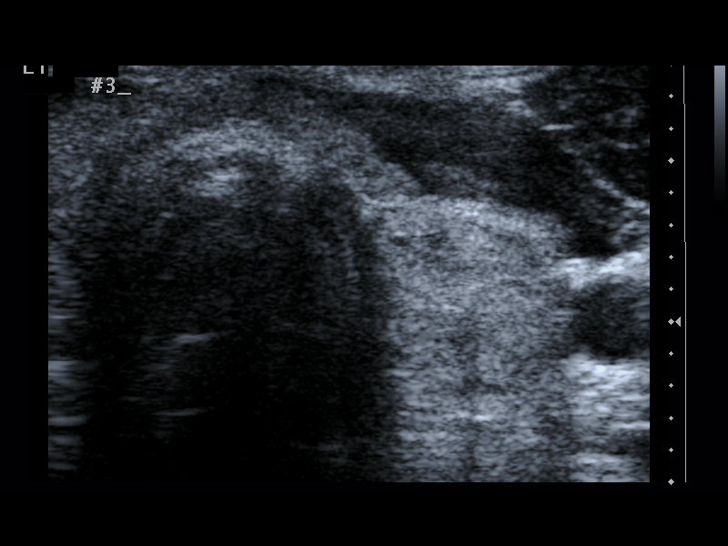
[im 13/16]
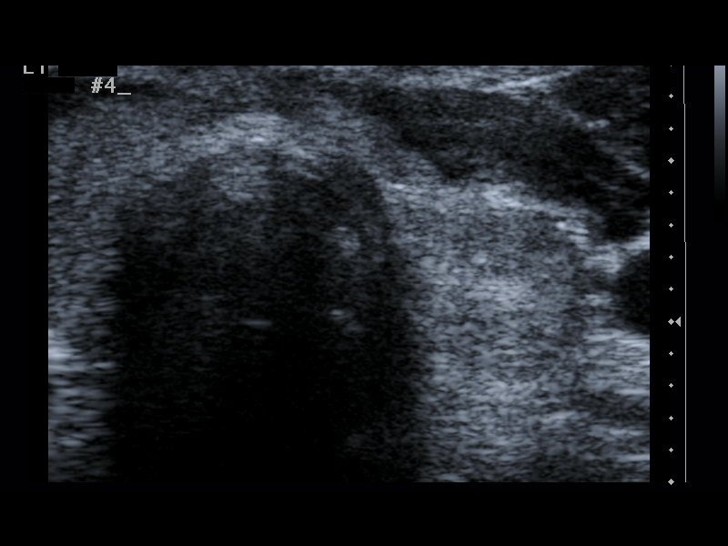
[im 14/16]
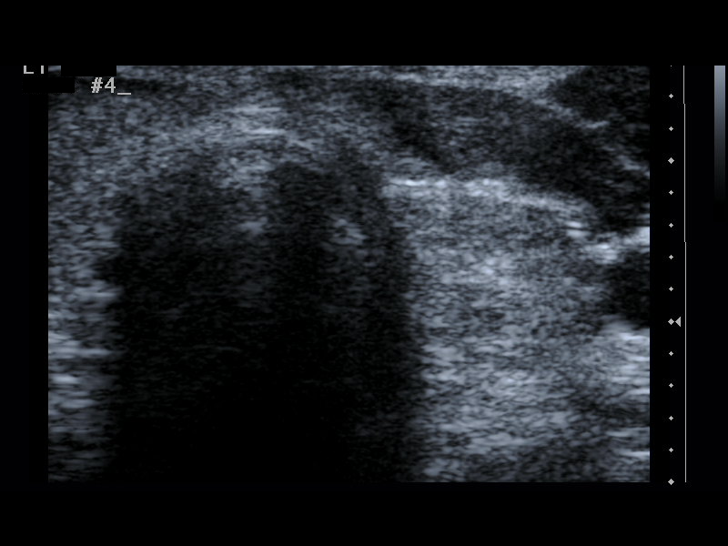
[im 15/16]
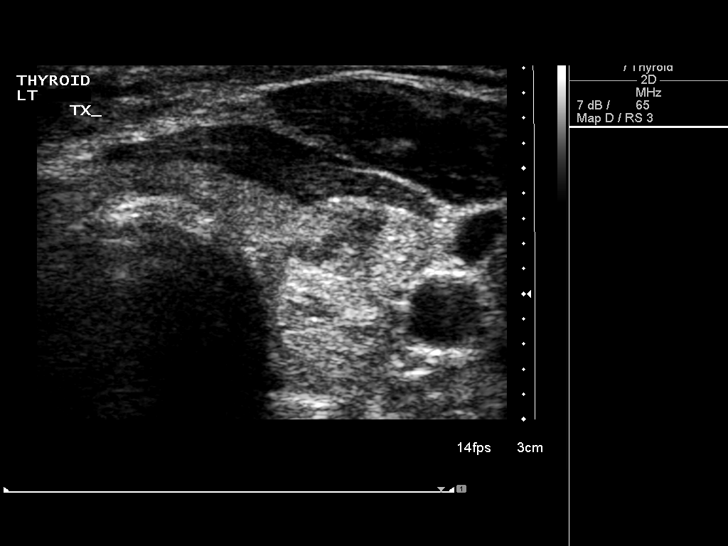
[im 16/16]
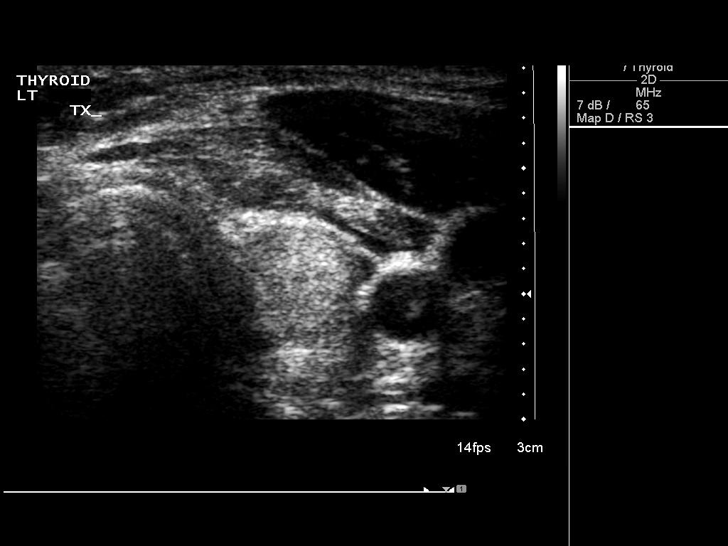

[14 of 16 positions shown; findings below may reference images not displayed]

PROCEDURE:
Thyroid biopsy was thoroughly discussed with the patient and
questions were answered. The benefits, risks, alternatives, and
complications were also discussed. The patient understands and
wishes to proceed with the procedure. Written consent was obtained.



Complications:  None
FINDINGS: Images document needle placement in the left lower pole dominant
nodule.
IMPRESSION: Ultrasound guided needle aspirate biopsy performed of the left
dominant thyroid nodule.

## 2015-07-16 ENCOUNTER — Other Ambulatory Visit: Payer: Self-pay

## 2015-07-16 DIAGNOSIS — F411 Generalized anxiety disorder: Secondary | ICD-10-CM

## 2015-07-16 MED ORDER — CITALOPRAM HYDROBROMIDE 20 MG PO TABS: 20.0000 mg | ORAL_TABLET | Freq: Every day | ORAL | Status: DC

## 2015-07-16 NOTE — Telephone Encounter (Signed)
Request faxed from pharmacy for a 90 days supply.//kn

## 2015-11-13 ENCOUNTER — Other Ambulatory Visit: Payer: Self-pay | Admitting: Surgery

## 2015-11-13 DIAGNOSIS — E041 Nontoxic single thyroid nodule: Secondary | ICD-10-CM

## 2016-01-04 ENCOUNTER — Telehealth: Payer: Self-pay | Admitting: Certified Nurse Midwife

## 2016-01-04 NOTE — Telephone Encounter (Signed)
Patient is having pain, feels like it is coming from her ovary. Patient has also gained "a lot of weight". Patient would like to talk with Verner Choleborah Leonard's nurse.

## 2016-01-04 NOTE — Telephone Encounter (Signed)
Spoke with patient. She states that her cycles after completing provera in October 2016 have been monthly but having become progressively heavier and more painful. LMP 12/24/14. Denies bleeding at this time. She states she is feeling bloating and weight gain of 20 lbs over the last 6 months as well. She states she is having R lower pelvic pain that radiates to her lower back but this is not new for her. She has been using more Pamprin and Motrin with cycles but this does not help the pain. She states she has been able to feel her R ovary for years and feels like she is having a problem with her ovary and menopausal symptoms she would like to discuss. Denies any increase in symptoms. No fevers, no N/V/D, no dysuria. States she does not feel sick but is concerned about her symptoms that have been ongoing.  She is scheduled to have office visit with Dr. Oscar LaJertson on Monday at 1430. She is advised to seek care at nearest emergency room if develops any increase in pain or any concerning symptoms over the weekend, patient states "I will be fine!" and states she will keep appointment for Monday as scheduled with Dr. Oscar LaJertson. cc  Leota Sauerseborah Leonard CNM

## 2016-01-07 ENCOUNTER — Ambulatory Visit (INDEPENDENT_AMBULATORY_CARE_PROVIDER_SITE_OTHER): Payer: BLUE CROSS/BLUE SHIELD | Admitting: Obstetrics and Gynecology

## 2016-01-07 ENCOUNTER — Encounter: Payer: Self-pay | Admitting: Obstetrics and Gynecology

## 2016-01-07 DIAGNOSIS — N946 Dysmenorrhea, unspecified: Secondary | ICD-10-CM | POA: Diagnosis not present

## 2016-01-07 DIAGNOSIS — N951 Menopausal and female climacteric states: Secondary | ICD-10-CM | POA: Diagnosis not present

## 2016-01-07 DIAGNOSIS — M545 Low back pain: Secondary | ICD-10-CM | POA: Diagnosis not present

## 2016-01-07 DIAGNOSIS — R635 Abnormal weight gain: Secondary | ICD-10-CM

## 2016-01-07 DIAGNOSIS — R5383 Other fatigue: Secondary | ICD-10-CM | POA: Diagnosis not present

## 2016-01-07 LAB — POCT URINALYSIS DIPSTICK
BILIRUBIN UA: NEGATIVE
GLUCOSE UA: NEGATIVE
Ketones, UA: NEGATIVE
Leukocytes, UA: NEGATIVE
Nitrite, UA: NEGATIVE
PH UA: 6.5
Protein, UA: NEGATIVE
RBC UA: NEGATIVE
Urobilinogen, UA: NEGATIVE

## 2016-01-07 LAB — CBC
HEMATOCRIT: 39.9 % (ref 35.0–45.0)
Hemoglobin: 13.4 g/dL (ref 11.7–15.5)
MCH: 31.5 pg (ref 27.0–33.0)
MCHC: 33.6 g/dL (ref 32.0–36.0)
MCV: 93.7 fL (ref 80.0–100.0)
MPV: 10.2 fL (ref 7.5–12.5)
PLATELETS: 285 10*3/uL (ref 140–400)
RBC: 4.26 MIL/uL (ref 3.80–5.10)
RDW: 13.2 % (ref 11.0–15.0)
WBC: 7.4 10*3/uL (ref 3.8–10.8)

## 2016-01-07 LAB — TSH: TSH: 2.57 mIU/L

## 2016-01-07 MED ORDER — NAPROXEN SODIUM 550 MG PO TABS: ORAL_TABLET | ORAL | Status: DC

## 2016-01-07 NOTE — Patient Instructions (Signed)
Exercising to Lose Weight Exercising can help you to lose weight. In order to lose weight through exercise, you need to do vigorous-intensity exercise. You can tell that you are exercising with vigorous intensity if you are breathing very hard and fast and cannot hold a conversation while exercising. Moderate-intensity exercise helps to maintain your current weight. You can tell that you are exercising at a moderate level if you have a higher heart rate and faster breathing, but you are still able to hold a conversation. HOW OFTEN SHOULD I EXERCISE? Choose an activity that you enjoy and set realistic goals. Your health care provider can help you to make an activity plan that works for you. Exercise regularly as directed by your health care provider. This may include:  Doing resistance training twice each week, such as:  Push-ups.  Sit-ups.  Lifting weights.  Using resistance bands.  Doing a given intensity of exercise for a given amount of time. Choose from these options:  150 minutes of moderate-intensity exercise every week.  75 minutes of vigorous-intensity exercise every week.  A mix of moderate-intensity and vigorous-intensity exercise every week. Children, pregnant women, people who are out of shape, people who are overweight, and older adults may need to consult a health care provider for individual recommendations. If you have any sort of medical condition, be sure to consult your health care provider before starting a new exercise program. WHAT ARE SOME ACTIVITIES THAT CAN HELP ME TO LOSE WEIGHT?   Walking at a rate of at least 4.5 miles an hour.  Jogging or running at a rate of 5 miles per hour.  Biking at a rate of at least 10 miles per hour.  Lap swimming.  Roller-skating or in-line skating.  Cross-country skiing.  Vigorous competitive sports, such as football, basketball, and soccer.  Jumping rope.  Aerobic dancing. HOW CAN I BE MORE ACTIVE IN MY DAY-TO-DAY  ACTIVITIES?  Use the stairs instead of the elevator.  Take a walk during your lunch break.  If you drive, park your car farther away from work or school.  If you take public transportation, get off one stop early and walk the rest of the way.  Make all of your phone calls while standing up and walking around.  Get up, stretch, and walk around every 30 minutes throughout the day. WHAT GUIDELINES SHOULD I FOLLOW WHILE EXERCISING?  Do not exercise so much that you hurt yourself, feel dizzy, or get very short of breath.  Consult your health care provider prior to starting a new exercise program.  Wear comfortable clothes and shoes with good support.  Drink plenty of water while you exercise to prevent dehydration or heat stroke. Body water is lost during exercise and must be replaced.  Work out until you breathe faster and your heart beats faster.   This information is not intended to replace advice given to you by your health care provider. Make sure you discuss any questions you have with your health care provider.   Document Released: 10/04/2010 Document Revised: 09/22/2014 Document Reviewed: 02/02/2014 Elsevier Interactive Patient Education 2016 Elsevier Inc. Back Pain, Adult Back pain is very common in adults.The cause of back pain is rarely dangerous and the pain often gets better over time.The cause of your back pain may not be known. Some common causes of back pain include:  Strain of the muscles or ligaments supporting the spine.  Wear and tear (degeneration) of the spinal disks.  Arthritis.  Direct injury to the  back. For many people, back pain may return. Since back pain is rarely dangerous, most people can learn to manage this condition on their own. HOME CARE INSTRUCTIONS Watch your back pain for any changes. The following actions may help to lessen any discomfort you are feeling:  Remain active. It is stressful on your back to sit or stand in one place for long  periods of time. Do not sit, drive, or stand in one place for more than 30 minutes at a time. Take short walks on even surfaces as soon as you are able.Try to increase the length of time you walk each day.  Exercise regularly as directed by your health care provider. Exercise helps your back heal faster. It also helps avoid future injury by keeping your muscles strong and flexible.  Do not stay in bed.Resting more than 1-2 days can delay your recovery.  Pay attention to your body when you bend and lift. The most comfortable positions are those that put less stress on your recovering back. Always use proper lifting techniques, including:  Bending your knees.  Keeping the load close to your body.  Avoiding twisting.  Find a comfortable position to sleep. Use a firm mattress and lie on your side with your knees slightly bent. If you lie on your back, put a pillow under your knees.  Avoid feeling anxious or stressed.Stress increases muscle tension and can worsen back pain.It is important to recognize when you are anxious or stressed and learn ways to manage it, such as with exercise.  Take medicines only as directed by your health care provider. Over-the-counter medicines to reduce pain and inflammation are often the most helpful.Your health care provider may prescribe muscle relaxant drugs.These medicines help dull your pain so you can more quickly return to your normal activities and healthy exercise.  Apply ice to the injured area:  Put ice in a plastic bag.  Place a towel between your skin and the bag.  Leave the ice on for 20 minutes, 2-3 times a day for the first 2-3 days. After that, ice and heat may be alternated to reduce pain and spasms.  Maintain a healthy weight. Excess weight puts extra stress on your back and makes it difficult to maintain good posture. SEEK MEDICAL CARE IF:  You have pain that is not relieved with rest or medicine.  You have increasing pain going  down into the legs or buttocks.  You have pain that does not improve in one week.  You have night pain.  You lose weight.  You have a fever or chills. SEEK IMMEDIATE MEDICAL CARE IF:   You develop new bowel or bladder control problems.  You have unusual weakness or numbness in your arms or legs.  You develop nausea or vomiting.  You develop abdominal pain.  You feel faint.   This information is not intended to replace advice given to you by your health care provider. Make sure you discuss any questions you have with your health care provider.   Document Released: 09/01/2005 Document Revised: 09/22/2014 Document Reviewed: 01/03/2014 Elsevier Interactive Patient Education [employer]2016 Elsevier Inc.

## 2016-01-07 NOTE — Progress Notes (Signed)
Patient ID: Lauren Proctor, female   DOB: Apr 17, 1961, 55 y.o.   MRN: [id number] GYNECOLOGY  VISIT   HPI: 55 y.o.   Married  Caucasian  female   G1P1 with No LMP recorded.   here c/o RLQ pain. She is also c/o bloating, and painful menstrual cycles.  The RLQ pain has been intermittent for years. She has had an XRay of her hip, slight arthritis, prior normal cycle. It used to be with bending. Now constant, dull achey pain. Stretching can help some. Her pain was worse in the RLQ with her last cycle, typically is, but not this bad. Ranges from 2-8/10 in severity. Getting worse with time. It can hurt with sitting still.  Normal BM qd, no change in urination, she has baseline frequency and urgency. No fevers. Sexually active, no pain, some dryness, using a lubricant.  Menses stopped for almost 3 months, since October (she took progesterone) she has been having monthly cycles x 3-7 days. Saturates a pad in 2-3 hours. Mild cramps and leg aches with her cycle. No current vasomotor symptoms.  She has gained 18 lbs since July. No change in her diet or exercise. She is very bloated, lots of gas. Fatigued. Exercises 5 days a week x 30 minutes   GYNECOLOGIC HISTORY: No LMP recorded. Contraception:None Menopausal hormone therapy: None         OB History    Gravida Para Term Preterm AB TAB SAB Ectopic Multiple Living   Patient Active Problem List   Diagnosis Date Noted  . Multiple thyroid nodules, left lobe 04/05/2014  . Anxiety state, unspecified 02/25/2013    Past Medical History  Diagnosis Date  . Anxiety     Over pelvic exams  . Arthritis 2006    Right Hip  . UTI (lower urinary tract infection)     Past Surgical History  Procedure Laterality Date  . Cesarean section  1992  . Tonsillectomy  1988  . Biopsy thyroid Left 03/2014    Current Outpatient Prescriptions  Medication Sig Dispense Refill  . Acetaminophen (TYLENOL PO) Take by mouth as needed.    .  Calcium-Vitamin D-Vitamin K (VIACTIV) 500-500-40 MG-UNT-MCG CHEW Chew by mouth.    . citalopram (CELEXA) 20 MG tablet Take 1 tablet (20 mg total) by mouth daily. 90 tablet 2  . Multiple Vitamins-Minerals (MULTIVITAMIN PO) Take by mouth daily.     . Naproxen Sodium (ALEVE PO) Take by mouth as needed. Aleve    . vitamin C (ASCORBIC ACID) 500 MG tablet Take 500 mg by mouth daily. Vitamin C     No current facility-administered medications for this visit.     ALLERGIES: Review of patient's allergies indicates no known allergies.  History reviewed. No pertinent family history.  Social History   Social History  . Marital Status: Married    Spouse Name: N/A  . Number of Children: N/A  . Years of Education: N/A   Occupational History  . Not on file.   Social History Main Topics  . Smoking status: Never Smoker   . Smokeless tobacco: Never Used  . Alcohol Use: No  . Drug Use: No  . Sexual Activity:    Partners: Male    Birth Control/ Protection: Condom   Other Topics Concern  . Not on file   Social History Narrative    Review of Systems  Constitutional:  Weight gain   HENT: Negative.   Eyes: Negative.   Respiratory: Negative.   Cardiovascular: Negative.   Gastrointestinal: Negative.   Genitourinary:       RLQ pain Heavy and painful menstrual cycle  Musculoskeletal: Positive for joint pain.  Skin: Negative.   Neurological: Negative.   Endo/Heme/Allergies: Negative.   Psychiatric/Behavioral: Negative.     PHYSICAL EXAMINATION:    There were no vitals taken for this visit.    General appearance: alert, cooperative and appears stated age Neck: no adenopathy, supple, symmetrical, trachea midline and thyroid right lobe > left, overall small.  Abdomen: soft, non-tender; bowel sounds normal; no masses,  no organomegaly Back: tender with palpation of the right lower back  Pelvic: External genitalia:  no lesions              Urethra:  normal appearing urethra with no  masses, tenderness or lesions              Bartholins and Skenes: normal                 Vagina: normal appearing vagina with very mild atrophy, normal color and discharge, no lesions              Cervix: no lesions              Bimanual Exam:  Uterus:  normal size, contour, position, consistency, mobility, non-tender and retroverted              Adnexa: no mass, fullness, tenderness              Rectovaginal: Yes.  .  Confirms.              Anus:  normal sphincter tone, no lesions  Pelvic floor: not tender  Chaperone was present for exam.  ASSESSMENT Right lower back pain, radiates to her right hip. Present long term, now worsening. Tender to palpation of her right lower back Weight gain Fatigue Perimenopausal, episodes of amenorrhea followed by regular cycles Mild vaginal dryness, helped with lubrication    PLAN TSH, CBC Anaprox DS for pain, aware not for long term daily use F/U with primary for further evaluation of her MS pain Discussed weight watchers and exercise    An After Visit Summary was printed and given to the patient.  25 minutes face to face time of which over 50% was spent in counseling.

## 2016-04-07 DIAGNOSIS — Z1231 Encounter for screening mammogram for malignant neoplasm of breast: Secondary | ICD-10-CM | POA: Diagnosis not present

## 2016-04-15 ENCOUNTER — Ambulatory Visit (INDEPENDENT_AMBULATORY_CARE_PROVIDER_SITE_OTHER): Payer: BLUE CROSS/BLUE SHIELD | Admitting: Certified Nurse Midwife

## 2016-04-15 ENCOUNTER — Encounter: Payer: Self-pay | Admitting: Certified Nurse Midwife

## 2016-04-15 VITALS — BP 98/64 | HR 68 | Resp 16 | Ht 64.25 in | Wt 142.0 lb

## 2016-04-15 DIAGNOSIS — Z01419 Encounter for gynecological examination (general) (routine) without abnormal findings: Secondary | ICD-10-CM

## 2016-04-15 DIAGNOSIS — F411 Generalized anxiety disorder: Secondary | ICD-10-CM

## 2016-04-15 DIAGNOSIS — Z0000 Encounter for general adult medical examination without abnormal findings: Secondary | ICD-10-CM | POA: Diagnosis not present

## 2016-04-15 DIAGNOSIS — Z124 Encounter for screening for malignant neoplasm of cervix: Secondary | ICD-10-CM | POA: Diagnosis not present

## 2016-04-15 MED ORDER — CITALOPRAM HYDROBROMIDE 20 MG PO TABS: 10.0000 mg | ORAL_TABLET | Freq: Every day | ORAL | 1 refills | Status: DC

## 2016-04-15 NOTE — Progress Notes (Addendum)
55 y.o. G1P1 Married  Caucasian Fe here for annual exam. Perimenopausal with cycle changes. Missed May and June periods and July was normal.  Celexa is still working for anxiety/depression. Sees [facility] as needed. Considering decreasing Celexa to 10 mg and see how she does.  LMP: 03-19-16         Sexually active: Yes.    The current method of family planning is condoms all the time.    Exercising: Yes.    walking Smoker:  no  Health Maintenance: Pap:  2015 neg MMG: 7/17 normal Colonoscopy:  2016 polyps f/u 67yrs BMD:   none TDaP:  2009 Shingles: no Pneumonia: no Hep C and HIV: HIV with pregnancy Labs: none Self breast exam: done monthly   reports that she has never smoked. She has never used smokeless tobacco. She reports that she does not drink alcohol or use drugs.  Past Medical History:  Diagnosis Date  . Anxiety    Over pelvic exams  . Arthritis 2006   Right Hip  . UTI (lower urinary tract infection)     Past Surgical History:  Procedure Laterality Date  . BIOPSY THYROID Left 03/2014  . CESAREAN SECTION  1992  . TONSILLECTOMY  1988    Current Outpatient Prescriptions  Medication Sig Dispense Refill  . Acetaminophen (TYLENOL PO) Take by mouth as needed.    . Calcium-Vitamin D-Vitamin K (VIACTIV) 500-500-40 MG-UNT-MCG CHEW Chew by mouth.    . citalopram (CELEXA) 20 MG tablet Take 1 tablet (20 mg total) by mouth daily. 90 tablet 2  . Multiple Vitamins-Minerals (MULTIVITAMIN PO) Take by mouth daily.     . Naproxen Sodium (ALEVE PO) Take by mouth as needed. Aleve    . naproxen sodium (ANAPROX DS) 550 MG tablet 1 tab po q 12 hours prn pain 30 tablet 2  . vitamin C (ASCORBIC ACID) 500 MG tablet Take 500 mg by mouth daily. Vitamin C     No current facility-administered medications for this visit.     No family history on file.  ROS:  Pertinent items are noted in HPI.  Otherwise, a comprehensive ROS was negative.  Exam:   There were no vitals taken for this visit.    Ht Readings from Last 3 Encounters:  06/25/15 5' 4.75" (1.645 m)  04/10/15 5' 4.75" (1.645 m)  10/05/14 5' 4.75" (1.645 m)    General appearance: alert, cooperative and appears stated age Head: Normocephalic, without obvious abnormality, atraumatic Neck: no adenopathy, supple, symmetrical, trachea midline and thyroid normal to inspection and palpation Lungs: clear to auscultation bilaterally Breasts: normal appearance, no masses or tenderness, No nipple retraction or dimpling, No nipple discharge or bleeding, No axillary or supraclavicular adenopathy Heart: regular rate and rhythm Abdomen: soft, non-tender; no masses,  no organomegaly Extremities: extremities normal, atraumatic, no cyanosis or edema Skin: Skin color, texture, turgor normal. No rashes or lesions Lymph nodes: Cervical, supraclavicular, and axillary nodes normal. No abnormal inguinal nodes palpated Neurologic: Grossly normal   Pelvic: External genitalia:  no lesions              Urethra:  normal appearing urethra with no masses, tenderness or lesions              Bartholin's and Skene's: normal                 Vagina: normal appearing vagina with normal color and discharge, no lesions  Cervix: no cervical motion tenderness, no lesions and normal appearance              Pap taken: Yes.   Bimanual Exam:  Uterus:  normal size, contour, position, consistency, mobility, non-tender              Adnexa: normal adnexa and no mass, fullness, tenderness               Rectovaginal: Confirms               Anus:  normal sphincter tone, no lesions  Chaperone present: yes  A:  Well Woman with normal exam  Perimenopausal with cycle changes  Anxiety and depression on Celexa desires to decrease dosage  Screening labs  P:   Reviewed health and wellness pertinent to exam  Continue to keep menses calendar and advise if no menses in 3 months. Patient aware and will advise. Discussed normal changes with weight and age  change.  Discussed possible changes she may note with decreasing Celexa, as also helps with perimenopausal changes. Patient will decrease to 10 mg daily(1/2 tablet) Will advise if problems and status in 2 months.  Rx Celexa 10 mg see order  Labs: Lipid panel, Vitamin D, Hep C  Pap smear as above with HPVHR   counseled on breast self exam, mammography screening, menopause, adequate intake of calcium and vitamin D, diet and exercise, Kegel's exercises  return annually or prn  An After Visit Summary was printed and given to the patient.

## 2016-04-15 NOTE — Patient Instructions (Signed)
EXERCISE AND DIET:  We recommended that you start or continue a regular exercise program for good health. Regular exercise means any activity that makes your heart beat faster and makes you sweat.  We recommend exercising at least 30 minutes per day at least 3 days a week, preferably 4 or 5.  We also recommend a diet low in fat and sugar.  Inactivity, poor dietary choices and obesity can cause diabetes, heart attack, stroke, and kidney damage, among others.    ALCOHOL AND SMOKING:  Women should limit their alcohol intake to no more than 7 drinks/beers/glasses of wine (combined, not each!) per week. Moderation of alcohol intake to this level decreases your risk of breast cancer and liver damage. And of course, no recreational drugs are part of a healthy lifestyle.  And absolutely no smoking or even second hand smoke. Most people know smoking can cause heart and lung diseases, but did you know it also contributes to weakening of your bones? Aging of your skin?  Yellowing of your teeth and nails?  CALCIUM AND VITAMIN D:  Adequate intake of calcium and Vitamin D are recommended.  The recommendations for exact amounts of these supplements seem to change often, but generally speaking 600 mg of calcium (either carbonate or citrate) and 800 units of Vitamin D per day seems prudent. Certain women may benefit from higher intake of Vitamin D.  If you are among these women, your doctor will have told you during your visit.    PAP SMEARS:  Pap smears, to check for cervical cancer or precancers,  have traditionally been done yearly, although recent scientific advances have shown that most women can have pap smears less often.  However, every woman still should have a physical exam from her gynecologist every year. It will include a breast check, inspection of the vulva and vagina to check for abnormal growths or skin changes, a visual exam of the cervix, and then an exam to evaluate the size and shape of the uterus and  ovaries.  And after 54 years of age, a rectal exam is indicated to check for rectal cancers. We will also provide age appropriate advice regarding health maintenance, like when you should have certain vaccines, screening for sexually transmitted diseases, bone density testing, colonoscopy, mammograms, etc.   MAMMOGRAMS:  All women over 40 years old should have a yearly mammogram. Many facilities now offer a "3D" mammogram, which may cost around $50 extra out of pocket. If possible,  we recommend you accept the option to have the 3D mammogram performed.  It both reduces the number of women who will be called back for extra views which then turn out to be normal, and it is better than the routine mammogram at detecting truly abnormal areas.    COLONOSCOPY:  Colonoscopy to screen for colon cancer is recommended for all women at age 50.  We know, you hate the idea of the prep.  We agree, BUT, having colon cancer and not knowing it is worse!!  Colon cancer so often starts as a polyp that can be seen and removed at colonscopy, which can quite literally save your life!  And if your first colonoscopy is normal and you have no family history of colon cancer, most women don't have to have it again for 10 years.  Once every ten years, you can do something that may end up saving your life, right?  We will be happy to help you get it scheduled when you are ready.    Be sure to check your insurance coverage so you understand how much it will cost.  It may be covered as a preventative service at no cost, but you should check your particular policy.     Perimenopause Perimenopause is the time when your body begins to move into the menopause (no menstrual period for 12 straight months). It is a natural process. Perimenopause can begin 2-8 years before the menopause and usually lasts for 1 year after the menopause. During this time, your ovaries may or may not produce an egg. The ovaries vary in their production of estrogen and  progesterone hormones each month. This can cause irregular menstrual periods, difficulty getting pregnant, vaginal bleeding between periods, and uncomfortable symptoms. CAUSES  Irregular production of the ovarian hormones, estrogen and progesterone, and not ovulating every month.  Other causes include:  Tumor of the pituitary gland in the brain.  Medical disease that affects the ovaries.  Radiation treatment.  Chemotherapy.  Unknown causes.  Heavy smoking and excessive alcohol intake can bring on perimenopause sooner. SIGNS AND SYMPTOMS   Hot flashes.  Night sweats.  Irregular menstrual periods.  Decreased sex drive.  Vaginal dryness.  Headaches.  Mood swings.  Depression.  Memory problems.  Irritability.  Tiredness.  Weight gain.  Trouble getting pregnant.  The beginning of losing bone cells (osteoporosis).  The beginning of hardening of the arteries (atherosclerosis). DIAGNOSIS  Your health care provider will make a diagnosis by analyzing your age, menstrual history, and symptoms. He or she will do a physical exam and note any changes in your body, especially your female organs. Female hormone tests may or may not be helpful depending on the amount of female hormones you produce and when you produce them. However, other hormone tests may be helpful to rule out other problems. TREATMENT  In some cases, no treatment is needed. The decision on whether treatment is necessary during the perimenopause should be made by you and your health care provider based on how the symptoms are affecting you and your lifestyle. Various treatments are available, such as:  Treating individual symptoms with a specific medicine for that symptom.  Herbal medicines that can help specific symptoms.  Counseling.  Group therapy. HOME CARE INSTRUCTIONS   Keep track of your menstrual periods (when they occur, how heavy they are, how long between periods, and how long they last) as  well as your symptoms and when they started.  Only take over-the-counter or prescription medicines as directed by your health care provider.  Sleep and rest.  Exercise.  Eat a diet that contains calcium (good for your bones) and soy (acts like the estrogen hormone).  Do not smoke.  Avoid alcoholic beverages.  Take vitamin supplements as recommended by your health care provider. Taking vitamin E may help in certain cases.  Take calcium and vitamin D supplements to help prevent bone loss.  Group therapy is sometimes helpful.  Acupuncture may help in some cases. SEEK MEDICAL CARE IF:   You have questions about any symptoms you are having.  You need a referral to a specialist (gynecologist, psychiatrist, or psychologist). SEEK IMMEDIATE MEDICAL CARE IF:   You have vaginal bleeding.  Your period lasts longer than 8 days.  Your periods are recurring sooner than 21 days.  You have bleeding after intercourse.  You have severe depression.  You have pain when you urinate.  You have severe headaches.  You have vision problems.   This information is not intended to replace advice   given to you by your health care provider. Make sure you discuss any questions you have with your health care provider.   Document Released: 10/09/2004 Document Revised: 09/22/2014 Document Reviewed: 03/31/2013 Elsevier Interactive Patient Education 2016 Elsevier Inc.  

## 2016-04-16 LAB — LIPID PANEL
CHOL/HDL RATIO: 2.7 ratio (ref <=5.0)
Cholesterol: 180 mg/dL (ref 125–200)
HDL: 66 mg/dL (ref >=46)
LDL Cholesterol: 99 mg/dL (ref <130)
Triglycerides: 75 mg/dL (ref <150)
VLDL: 15 mg/dL (ref <30)

## 2016-04-16 LAB — VITAMIN D 25 HYDROXY (VIT D DEFICIENCY, FRACTURES): VIT D 25 HYDROXY: 34 ng/mL (ref 30–100)

## 2016-04-16 LAB — HEPATITIS C ANTIBODY: HCV AB: NEGATIVE

## 2016-04-16 NOTE — Progress Notes (Signed)
Reviewed personally.  M. Suzanne Juliauna Stueve, MD.  

## 2016-04-17 ENCOUNTER — Encounter: Payer: Self-pay | Admitting: Certified Nurse Midwife

## 2016-04-17 LAB — IPS PAP TEST WITH HPV

## 2016-04-27 ENCOUNTER — Other Ambulatory Visit: Payer: Self-pay | Admitting: Certified Nurse Midwife

## 2016-04-27 DIAGNOSIS — F411 Generalized anxiety disorder: Secondary | ICD-10-CM

## 2016-05-05 ENCOUNTER — Encounter: Payer: Self-pay | Admitting: Certified Nurse Midwife

## 2016-05-06 ENCOUNTER — Telehealth: Payer: Self-pay | Admitting: *Deleted

## 2016-05-06 NOTE — Telephone Encounter (Signed)
See Phone note 

## 2016-05-06 NOTE — Telephone Encounter (Signed)
Spoke with patient. Patient reports she decreased her Citalopram dosage from 20 mg daily to 10 mg daily for 10 days. Reports she felt "shaky, nervous, and nauseous" when she was taking 10 mg. Increased her dose back to 20 mg daily and reports her symptoms went away. "I feel good on the 20 mg dose." Reports she has enough medication for another month and will contact the office when she needs further refills.  Routing to provider for final review. Patient agreeable to disposition. Will close encounter.

## 2016-05-06 NOTE — Telephone Encounter (Signed)
Patient is returning call to triage. 

## 2016-05-06 NOTE — Telephone Encounter (Signed)
My Chart message from patient:  HI , this is Lauren Proctor. Wanted to let you know that i tried half the dosage of Citalopram for about 10 days. Started feeling nervous and over all not feeling well. So back taking my original dosage.Wanted to let you know so it could be changed in my charts and will need refills in a couple of months or so.                    thank you   Call to patient. Left message to call back. Ask for triage.

## 2016-06-11 ENCOUNTER — Telehealth: Payer: Self-pay | Admitting: Certified Nurse Midwife

## 2016-06-11 DIAGNOSIS — F411 Generalized anxiety disorder: Secondary | ICD-10-CM

## 2016-06-11 NOTE — Telephone Encounter (Signed)
Medication refill request: Celexa 20mg  Last AEX:  04/15/16 DL Next AEX: 2/4/408/7/18 Last MMG (if hormonal medication request): 04/07/16 BIRADS1 negative Refill authorized: 04/15/16 #90 w/1 refill; see encounter from 05/06/16 - [facility] back to 20mg 

## 2016-06-11 NOTE — Telephone Encounter (Signed)
Pt wants to increase Celexa to 1 pill a day.  [facility] [address]

## 2016-06-20 DIAGNOSIS — J029 Acute pharyngitis, unspecified: Secondary | ICD-10-CM | POA: Diagnosis not present

## 2016-06-20 DIAGNOSIS — J0140 Acute pansinusitis, unspecified: Secondary | ICD-10-CM | POA: Diagnosis not present

## 2016-06-23 ENCOUNTER — Other Ambulatory Visit: Payer: Self-pay | Admitting: Certified Nurse Midwife

## 2016-06-23 DIAGNOSIS — F411 Generalized anxiety disorder: Secondary | ICD-10-CM

## 2016-06-23 NOTE — Telephone Encounter (Signed)
Second request. Medication refill request: Celexa  Last AEX:  04/15/16 DL  Next AEX: 1/6/108/7/18 DL Last MMG (if hormonal medication request): 04/07/16 BIRADS1: Neg Refill authorized: #[id number]/[id number]. Today please advise.   First refill request sent to Mrs Eunice BlaseDebbie 06/11/16

## 2016-06-24 ENCOUNTER — Other Ambulatory Visit: Payer: Self-pay | Admitting: Certified Nurse Midwife

## 2016-06-24 NOTE — Telephone Encounter (Signed)
Will refill until her next appointment

## 2016-08-15 DIAGNOSIS — G479 Sleep disorder, unspecified: Secondary | ICD-10-CM | POA: Diagnosis not present

## 2016-08-15 DIAGNOSIS — H9202 Otalgia, left ear: Secondary | ICD-10-CM | POA: Diagnosis not present

## 2016-08-15 DIAGNOSIS — B001 Herpesviral vesicular dermatitis: Secondary | ICD-10-CM | POA: Diagnosis not present

## 2016-10-08 ENCOUNTER — Encounter: Payer: Self-pay | Admitting: Certified Nurse Midwife

## 2016-10-08 ENCOUNTER — Ambulatory Visit (INDEPENDENT_AMBULATORY_CARE_PROVIDER_SITE_OTHER): Payer: BLUE CROSS/BLUE SHIELD | Admitting: Certified Nurse Midwife

## 2016-10-08 VITALS — BP 110/70 | HR 70 | Resp 16 | Ht 64.25 in | Wt 146.0 lb

## 2016-10-08 DIAGNOSIS — N912 Amenorrhea, unspecified: Secondary | ICD-10-CM | POA: Diagnosis not present

## 2016-10-08 DIAGNOSIS — N951 Menopausal and female climacteric states: Secondary | ICD-10-CM | POA: Diagnosis not present

## 2016-10-08 LAB — TSH: TSH: 2.09 mIU/L

## 2016-10-08 LAB — POCT URINE PREGNANCY: Preg Test, Ur: NEGATIVE

## 2016-10-08 NOTE — Progress Notes (Signed)
56 y.o. Married Caucasian female G1P1 here with complaint of no period since 04/24/16. She is experiencing hot flashes and night sweats. Some vaginal dryness, but no change from before. Patient is also taking Celexa for depression/anxiety, working well. Has noted some cramping "like period onset, but  No bleeding". Contraception is none or some condom use in several years. Would like to have some help with symptoms if possible. No other health issues today.   ROS: pertinent to HPI  O: Healthy WD,WN female Affect: normal, orientation x 3 Skin:warm and dry Abdomen:soft non tender  Pelvic exam:EXTERNAL GENITALIA: normal appearing vulva with no masses, tenderness or lesions VAGINA: no abnormal discharge or lesions CERVIX: no lesions or cervical motion tenderness and normal appearance UTERUS: anteverted and normal size, non tender, no masses ADNEXA: no masses palpable and nontender RECTUM: exam not indicated  A:Normal pelvic exam  Perimenopausal with amenorrhea and menopausal symptoms, Contraception none in several years Celexa use for anxiety/depression Trazadone use for insomnia per PCP   P: Discussed findings of normal pelvic exam. Discussed perimenopause/menopause and  etiology, bleeding profile and changes. Discussed concerns with prolonged amenorrhea and excessive bleeding occurrence or increase risk of hyperplasia if no bleeding and uterine lining needs to shed. Discussed possible Provera challenge. Symptoms can also be related to thyroid or pituitary change also. Discussed evaluation with lab work, patient agreeable. Labs: FSH,TSH,Prolactin, HCG qual  Will discuss symptom relief if needed once labs are in. Patient given Menopausal Handout for information. Questions addressed.   RV prn

## 2016-10-08 NOTE — Patient Instructions (Signed)

## 2016-10-09 ENCOUNTER — Other Ambulatory Visit: Payer: Self-pay | Admitting: Certified Nurse Midwife

## 2016-10-09 DIAGNOSIS — N912 Amenorrhea, unspecified: Secondary | ICD-10-CM

## 2016-10-09 LAB — FOLLICLE STIMULATING HORMONE: FSH: 95.6 m[IU]/mL

## 2016-10-09 LAB — PROLACTIN: Prolactin: 15.2 ng/mL

## 2016-10-09 LAB — HCG, SERUM, QUALITATIVE: PREG SERUM: UNDETERMINED — AB

## 2016-10-09 NOTE — Progress Notes (Signed)
Encounter reviewed Cohan Stipes, MD   

## 2016-10-13 ENCOUNTER — Other Ambulatory Visit (INDEPENDENT_AMBULATORY_CARE_PROVIDER_SITE_OTHER): Payer: BLUE CROSS/BLUE SHIELD

## 2016-10-13 DIAGNOSIS — N912 Amenorrhea, unspecified: Secondary | ICD-10-CM | POA: Diagnosis not present

## 2016-10-14 ENCOUNTER — Telehealth: Payer: Self-pay

## 2016-10-14 ENCOUNTER — Other Ambulatory Visit: Payer: Self-pay | Admitting: Certified Nurse Midwife

## 2016-10-14 DIAGNOSIS — N951 Menopausal and female climacteric states: Secondary | ICD-10-CM

## 2016-10-14 DIAGNOSIS — N912 Amenorrhea, unspecified: Secondary | ICD-10-CM

## 2016-10-14 DIAGNOSIS — Z3202 Encounter for pregnancy test, result negative: Secondary | ICD-10-CM

## 2016-10-14 LAB — HCG, SERUM, QUALITATIVE: PREG SERUM: NEGATIVE

## 2016-10-14 MED ORDER — MEDROXYPROGESTERONE ACETATE 10 MG PO TABS: 10.0000 mg | ORAL_TABLET | Freq: Every day | ORAL | 0 refills | Status: DC

## 2016-10-14 NOTE — Telephone Encounter (Signed)
lmtcb

## 2016-10-14 NOTE — Telephone Encounter (Signed)
-----   Message from Verner Choleborah S Leonard, CNM sent at 10/14/2016  8:00 AM EST ----- Notify patient that serum pregnancy test is negative. So with the other results showing menopausal levels and no period since 8/17. Needs Provera challenge. Order in for Provera 10 mg x 10 days. Patient needs to call if bleeding or no bleeding up to two weeks after last dose of Provera, for continuing monitoring.

## 2016-10-14 NOTE — Telephone Encounter (Signed)
Patient notified of results. See lab 

## 2016-11-11 ENCOUNTER — Ambulatory Visit (INDEPENDENT_AMBULATORY_CARE_PROVIDER_SITE_OTHER): Payer: BLUE CROSS/BLUE SHIELD | Admitting: Certified Nurse Midwife

## 2016-11-11 ENCOUNTER — Encounter: Payer: Self-pay | Admitting: Certified Nurse Midwife

## 2016-11-11 VITALS — BP 100/60 | HR 60 | Resp 16 | Ht 64.25 in | Wt 146.0 lb

## 2016-11-11 DIAGNOSIS — N951 Menopausal and female climacteric states: Secondary | ICD-10-CM | POA: Diagnosis not present

## 2016-11-12 NOTE — Progress Notes (Signed)
56 y.o.  G1P1 Married Caucasian female here to discuss menopause and possible HRT.  LMP 8/72017 Patient reports she's having occasional hot flashes,and some insomnia. Patient currently takes Celexa for anxiety, so no mood changes or anxious episodes at this point. She also uses Trazodone for insomnia if needed per PCP. Patient here for consult only to discuss menopausal expectations and risks of HRT if it was needed. Patient recent labs indicated menopausal range, but has not went one year without period. See note form 10/08/16. Denies vaginal dryness or vaginal bleeding since last office visit.. Feels she is doing well but "wanted to be prepared". Was given booklet at office visit that has helped. No other health issues today.  Past Medical History:  Diagnosis Date  . Anxiety    Over pelvic exams  . Arthritis 2006   Right Hip  . UTI (lower urinary tract infection)     Heart disease:  No. DVT history:  No. Breast Cancer:  No.  Last Mammogram: 04/07/16 negative  Last AEX:  04/15/16 Last Pap smear:  04/15/16 negative  The patient has a family history of no significant history.  Discussed with patient risks and benefits and specifically the WHI study including but not limited to risks of increased risks of heart disease, MI, stroke, DVT, and breast cancer.  Increased risks of gall bladder disease and change in cholesterol panels also discussed.  Possibility of bleeding was discussed as patient does have a uterus.  Benefits of improved quality of life, improved bone density and decreased risks of colon cancer also discussed.    Recent lab work:  FSH: 95.6        TSH: 2.09         AMH: not drawn  Assessment:  Symptomatic menopausal symptoms  History of Anxiety on Celexa  History of Insomnia with Trazodone use prn per PCP Not amenorrhea for one year  Plan:  Discussed with patient that Celexa can act similar to HRT with relief of some symptoms with mood and hot flashes and night sweats. Discussed  feel er her description this is occurring. Also has insomnia sporadic and good relief with Trazadone, but cautioned long term use. Patient feels she is doing fine at this point and is not interested in HRT use unless "last resort".  Discussed expectations of possible bleeding again and need to advise. If no period in one year then would be PMB if occurred and important to advise. Encouraged good diet and exercise for minimal health changes with menopause. Questions addressed at length.  Rv prn, aex  35 minutes in face to face discussion regarding Menopause and HRT. .Marland Kitchen

## 2016-11-12 NOTE — Progress Notes (Signed)
Encounter reviewed Porfiria Heinrich, MD   

## 2016-11-12 NOTE — Patient Instructions (Signed)
Perimenopause Perimenopause is the time when your body begins to move into the menopause (no menstrual period for 12 straight months). It is a natural process. Perimenopause can begin 2-8 years before the menopause and usually lasts for 1 year after the menopause. During this time, your ovaries may or may not produce an egg. The ovaries vary in their production of estrogen and progesterone hormones each month. This can cause irregular menstrual periods, difficulty getting pregnant, vaginal bleeding between periods, and uncomfortable symptoms. What are the causes?  Irregular production of the ovarian hormones, estrogen and progesterone, and not ovulating every month. Other causes include:  Tumor of the pituitary gland in the brain.  Medical disease that affects the ovaries.  Radiation treatment.  Chemotherapy.  Unknown causes.  Heavy smoking and excessive alcohol intake can bring on perimenopause sooner. What are the signs or symptoms?  Hot flashes.  Night sweats.  Irregular menstrual periods.  Decreased sex drive.  Vaginal dryness.  Headaches.  Mood swings.  Depression.  Memory problems.  Irritability.  Tiredness.  Weight gain.  Trouble getting pregnant.  The beginning of losing bone cells (osteoporosis).  The beginning of hardening of the arteries (atherosclerosis). How is this diagnosed? Your health care provider will make a diagnosis by analyzing your age, menstrual history, and symptoms. He or she will do a physical exam and note any changes in your body, especially your female organs. Female hormone tests may or may not be helpful depending on the amount of female hormones you produce and when you produce them. However, other hormone tests may be helpful to rule out other problems. How is this treated? In some cases, no treatment is needed. The decision on whether treatment is necessary during the perimenopause should be made by you and your health care  provider based on how the symptoms are affecting you and your lifestyle. Various treatments are available, such as:  Treating individual symptoms with a specific medicine for that symptom.  Herbal medicines that can help specific symptoms.  Counseling.  Group therapy. Follow these instructions at home:  Keep track of your menstrual periods (when they occur, how heavy they are, how long between periods, and how long they last) as well as your symptoms and when they started.  Only take over-the-counter or prescription medicines as directed by your health care provider.  Sleep and rest.  Exercise.  Eat a diet that contains calcium (good for your bones) and soy (acts like the estrogen hormone).  Do not smoke.  Avoid alcoholic beverages.  Take vitamin supplements as recommended by your health care provider. Taking vitamin E may help in certain cases.  Take calcium and vitamin D supplements to help prevent bone loss.  Group therapy is sometimes helpful.  Acupuncture may help in some cases. Contact a health care provider if:  You have questions about any symptoms you are having.  You need a referral to a specialist (gynecologist, psychiatrist, or psychologist). Get help right away if:  You have vaginal bleeding.  Your period lasts longer than 8 days.  Your periods are recurring sooner than 21 days.  You have bleeding after intercourse.  You have severe depression.  You have pain when you urinate.  You have severe headaches.  You have vision problems. This information is not intended to replace advice given to you by your health care provider. Make sure you discuss any questions you have with your health care provider. Document Released: 10/09/2004 Document Revised: 02/07/2016 Document Reviewed: 03/31/2013 Elsevier Interactive   Patient Education  2017 [employer]  

## 2016-12-31 ENCOUNTER — Ambulatory Visit (INDEPENDENT_AMBULATORY_CARE_PROVIDER_SITE_OTHER): Payer: BLUE CROSS/BLUE SHIELD | Admitting: Certified Nurse Midwife

## 2016-12-31 ENCOUNTER — Encounter: Payer: Self-pay | Admitting: Certified Nurse Midwife

## 2016-12-31 VITALS — BP 96/60 | HR 80 | Temp 98.0°F | Resp 16 | Ht 64.25 in | Wt 144.0 lb

## 2016-12-31 DIAGNOSIS — N898 Other specified noninflammatory disorders of vagina: Secondary | ICD-10-CM

## 2016-12-31 DIAGNOSIS — R300 Dysuria: Secondary | ICD-10-CM | POA: Diagnosis not present

## 2016-12-31 LAB — POCT URINALYSIS DIPSTICK
BILIRUBIN UA: NEGATIVE
Blood, UA: NEGATIVE
Glucose, UA: NEGATIVE
KETONES UA: NEGATIVE
Nitrite, UA: NEGATIVE
PH UA: 5 (ref 5.0–8.0)
PROTEIN UA: NEGATIVE

## 2016-12-31 NOTE — Progress Notes (Signed)
R/M

## 2016-12-31 NOTE — Progress Notes (Signed)
56 y.o. Married Caucasian female G1P1 here with complaint of urinating in bed with being aware and recent dribbling after urination, with onset  on 4 days ago with first episode. Patient complaining of urinary frequency, no  pain with urination. Patient denies fever, chills, nausea or back pain. No new personal products. Patient feels not related to sexual activity. Denies any vaginal symptoms.   . Menopausal with ? vaginal dryness. Patient consuming adequate water intake. Patient recently used bath salts for several days and noted symptoms after use. Hot flashes and night sweats no issues at this point. Celexa helping with . No other health issues today.  ROS pertinent to HPI  O: Healthy female WDWN Affect: Normal, orientation x 3 Skin : warm and dry CVAT:  negative bilateral Abdomen: slight positive for suprapubic tenderness  Pelvic exam: External genital area: normal, no lesions, but slight increase pink, no scaling or tenderness Bladder,Urethra slightly tender, Urethral meatus: slightly tender, red Vagina: normal vaginal discharge, normal appearance , but introital area increase pink and slightly tender, dryness noted affirm taken Cervix: normal, non tender Uterus:normal,non tender Adnexa: normal non tender, no fullness or masses   A:Normal pelvic exam R/O UTI R/O vaginitis suspect irritation from bath salts Vaginal dryness  P: Reviewed findings of normal pelvic exam and suspect irritation from bath salts. Discussed using Aveeno bath for comfort, decrease caffeine and increase water intake. Use coconut oil on area that feels irritated and around urinary meatus. Will treat if indicated by lab results or if symptoms increase.    ZOX:WRUEA micro, culture Reviewed warning signs and symptoms of UTI and need to advise if occurring. Encouraged to limit soda, tea, and coffee and be sure to increase water intake. Lab Affirm Discussed daily use of coconut oil vaginally for dryness and advise  if no change.   RV prn

## 2016-12-31 NOTE — Patient Instructions (Signed)

## 2017-01-01 LAB — URINALYSIS, MICROSCOPIC ONLY
Bacteria, UA: NONE SEEN [HPF]
CASTS: NONE SEEN [LPF]
Crystals: NONE SEEN [HPF]
WBC UA: NONE SEEN WBC/HPF (ref <=5)
Yeast: NONE SEEN [HPF]

## 2017-01-01 LAB — WET PREP BY MOLECULAR PROBE
CANDIDA SPECIES: NOT DETECTED
GARDNERELLA VAGINALIS: NOT DETECTED
TRICHOMONAS VAG: NOT DETECTED

## 2017-01-02 LAB — URINE CULTURE

## 2017-01-03 NOTE — Progress Notes (Signed)
Encounter reviewed Tedra Coppernoll, MD   

## 2017-01-22 DIAGNOSIS — G479 Sleep disorder, unspecified: Secondary | ICD-10-CM | POA: Diagnosis not present

## 2017-01-29 ENCOUNTER — Other Ambulatory Visit: Payer: Self-pay | Admitting: Certified Nurse Midwife

## 2017-01-29 DIAGNOSIS — F411 Generalized anxiety disorder: Secondary | ICD-10-CM

## 2017-01-30 ENCOUNTER — Other Ambulatory Visit: Payer: Self-pay

## 2017-01-30 DIAGNOSIS — F411 Generalized anxiety disorder: Secondary | ICD-10-CM

## 2017-01-30 MED ORDER — CITALOPRAM HYDROBROMIDE 20 MG PO TABS: 20.0000 mg | ORAL_TABLET | Freq: Every day | ORAL | 1 refills | Status: DC

## 2017-01-30 NOTE — Telephone Encounter (Signed)
Medication refill request: Citalopram Last AEX:  04/15/16 DL Next AEX: 4/0/988/7/17 DL Last MMG (if hormonal medication request): 04/07/16 BIRADS1, Density B, [employer] Refill authorized: 06/24/16 #90 2R. Please advise. Thank you.

## 2017-03-11 DIAGNOSIS — H01004 Unspecified blepharitis left upper eyelid: Secondary | ICD-10-CM | POA: Diagnosis not present

## 2017-03-11 DIAGNOSIS — H01001 Unspecified blepharitis right upper eyelid: Secondary | ICD-10-CM | POA: Diagnosis not present

## 2017-03-11 DIAGNOSIS — H04123 Dry eye syndrome of bilateral lacrimal glands: Secondary | ICD-10-CM | POA: Diagnosis not present

## 2017-03-19 DIAGNOSIS — H5213 Myopia, bilateral: Secondary | ICD-10-CM | POA: Diagnosis not present

## 2017-03-19 DIAGNOSIS — H04123 Dry eye syndrome of bilateral lacrimal glands: Secondary | ICD-10-CM | POA: Diagnosis not present

## 2017-04-08 DIAGNOSIS — Z1231 Encounter for screening mammogram for malignant neoplasm of breast: Secondary | ICD-10-CM | POA: Diagnosis not present

## 2017-04-21 ENCOUNTER — Ambulatory Visit (INDEPENDENT_AMBULATORY_CARE_PROVIDER_SITE_OTHER): Payer: BLUE CROSS/BLUE SHIELD | Admitting: Certified Nurse Midwife

## 2017-04-21 ENCOUNTER — Ambulatory Visit: Payer: BLUE CROSS/BLUE SHIELD | Admitting: Certified Nurse Midwife

## 2017-04-21 ENCOUNTER — Encounter: Payer: Self-pay | Admitting: Certified Nurse Midwife

## 2017-04-21 VITALS — BP 108/68 | HR 72 | Resp 16 | Ht 64.0 in | Wt 143.0 lb

## 2017-04-21 DIAGNOSIS — Z0000 Encounter for general adult medical examination without abnormal findings: Secondary | ICD-10-CM | POA: Diagnosis not present

## 2017-04-21 DIAGNOSIS — F411 Generalized anxiety disorder: Secondary | ICD-10-CM

## 2017-04-21 DIAGNOSIS — N951 Menopausal and female climacteric states: Secondary | ICD-10-CM | POA: Diagnosis not present

## 2017-04-21 DIAGNOSIS — Z01419 Encounter for gynecological examination (general) (routine) without abnormal findings: Secondary | ICD-10-CM

## 2017-04-21 MED ORDER — CITALOPRAM HYDROBROMIDE 20 MG PO TABS: 20.0000 mg | ORAL_TABLET | Freq: Every day | ORAL | 3 refills | Status: DC

## 2017-04-21 NOTE — Progress Notes (Signed)
56 y.o. G1P1 Married  Caucasian Fe here for annual exam. LMP 08/2016, no spotting or bleeding since that time. Having some hot flashes/night sweats, no insomnia with Trazadone use.Some vaginal dryness, uses coconut oil with good response. Celexa working well for anxiety and menopausal changes. Sees PCP prn was seen for sinus infection. 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 No other health issues today.  No LMP recorded. Patient is perimenopausal.          Sexually active: Yes.    The current method of family planning is menopausal.    Exercising: Yes.    walking & yoga Smoker:  no  Health Maintenance: Pap:  2015 neg, 04-15-16 neg HPV HR neg History of Abnormal Pap: no MMG:  2018 neg per patient Self Breast exams: yes Colonoscopy:  2016 polyps f/u 51yrs BMD:   none TDaP:  2009 Shingles: no Pneumonia: no Hep C and HIV: Hep c neg 2017 Labs: yes   reports that she has never smoked. She has never used smokeless tobacco. She reports that she does not drink alcohol or use drugs.  Past Medical History:  Diagnosis Date  . Anxiety    Over pelvic exams  . Arthritis 2006   Right Hip  . UTI (lower urinary tract infection)     Past Surgical History:  Procedure Laterality Date  . BIOPSY THYROID Left 03/2014  . CESAREAN SECTION  1992  . TONSILLECTOMY  1988    Current Outpatient Prescriptions  Medication Sig Dispense Refill  . Acetaminophen (TYLENOL PO) Take by mouth as needed.    . Calcium-Vitamin D-Vitamin K (VIACTIV) 500-500-40 MG-UNT-MCG CHEW Chew by mouth.    . citalopram (CELEXA) 20 MG tablet Take 1 tablet (20 mg total) by mouth daily. 90 tablet 1  . Multiple Vitamins-Minerals (MULTIVITAMIN PO) Take by mouth daily.     . Naproxen Sodium (ALEVE PO) Take by mouth as needed. Aleve    . traZODone (DESYREL) 50 MG tablet Take 50 mg by mouth at bedtime as needed.     . vitamin C (ASCORBIC ACID) 500 MG tablet Take 500 mg by mouth daily. Vitamin C     No current  facility-administered medications for this visit.     History reviewed. No pertinent family history.  ROS:  Pertinent items are noted in HPI.  Otherwise, a comprehensive ROS was negative.  Exam:   BP 108/68   Pulse 72   Resp 16   Ht 5\' 4"  (1.626 m)   Wt 143 lb (64.9 kg)   BMI 24.55 kg/m  Height: 5\' 4"  (162.6 cm) Ht Readings from Last 3 Encounters:  04/21/17 5\' 4"  (1.626 m)  12/31/16 5' 4.25" (1.632 m)  11/11/16 5' 4.25" (1.632 m)    General appearance: alert, cooperative and appears stated age Head: Normocephalic, without obvious abnormality, atraumatic Neck: no adenopathy, supple, symmetrical, trachea midline and thyroid normal to inspection and palpation Lungs: clear to auscultation bilaterally Breasts: normal appearance, no masses or tenderness, No nipple retraction or dimpling, No nipple discharge or bleeding, No axillary or supraclavicular adenopathy Heart: regular rate and rhythm Abdomen: soft, non-tender; no masses,  no organomegaly Extremities: extremities normal, atraumatic, no cyanosis or edema Skin: Skin color, texture, turgor normal. No rashes or lesions Lymph nodes: Cervical, supraclavicular, and axillary nodes normal. No abnormal inguinal nodes palpated Neurologic: Grossly normal   Pelvic: External genitalia:  no lesions              Urethra:  normal  appearing urethra with no masses, tenderness or lesions              Bartholin's and Skene's: normal                 Vagina: normal appearing vagina with normal color and discharge, no lesions              Cervix: no cervical motion tenderness, no lesions and normal appearance              Pap taken: No. Bimanual Exam:  Uterus:  normal size, contour, position, consistency, mobility, non-tender              Adnexa: normal adnexa and no mass, fullness, tenderness               Rectovaginal: Confirms               Anus:  normal sphincter tone, no lesions  Chaperone present: yes  A:  Well Woman with normal  exam  Menopausal no HRT  Anxiety Celexa working well for and for menopausal changes  PCP management of medication for insomnia  Screening labs  P:   Reviewed health and wellness pertinent to exam  Aware of need to evaluate if vaginal bleeding  Rx Celexa see order with instructions, warning signs given for use  Continue follow up with PCP as indicated  Patient to schedule BMD with next mammogram  Labs: CMP,Lipid panel, Vitamin D  Pap smear: no   counseled on breast self exam, feminine hygiene, menopause, adequate intake of calcium and vitamin D, diet and exercise  return annually or prn  An After Visit Summary was printed and given to the patient.

## 2017-04-21 NOTE — Patient Instructions (Signed)

## 2017-04-22 LAB — COMPREHENSIVE METABOLIC PANEL
ALT: 14 IU/L (ref 0–32)
AST: 21 IU/L (ref 0–40)
Albumin/Globulin Ratio: 2 (ref 1.2–2.2)
Albumin: 4.1 g/dL (ref 3.5–5.5)
Alkaline Phosphatase: 78 IU/L (ref 39–117)
BUN/Creatinine Ratio: 13 (ref 9–23)
BUN: 8 mg/dL (ref 6–24)
Bilirubin Total: <0.2 mg/dL (ref 0.0–1.2)
CALCIUM: 9.4 mg/dL (ref 8.7–10.2)
CO2: 23 mmol/L (ref 20–29)
CREATININE: 0.63 mg/dL (ref 0.57–1.00)
Chloride: 100 mmol/L (ref 96–106)
GFR calc Af Amer: 118 mL/min/{1.73_m2} (ref >59)
GFR calc non Af Amer: 102 mL/min/{1.73_m2} (ref >59)
Globulin, Total: 2.1 g/dL (ref 1.5–4.5)
Glucose: 78 mg/dL (ref 65–99)
POTASSIUM: 4.3 mmol/L (ref 3.5–5.2)
SODIUM: 139 mmol/L (ref 134–144)
Total Protein: 6.2 g/dL (ref 6.0–8.5)

## 2017-04-22 LAB — VITAMIN D 25 HYDROXY (VIT D DEFICIENCY, FRACTURES): Vit D, 25-Hydroxy: 40.2 ng/mL (ref 30.0–100.0)

## 2017-04-22 LAB — LIPID PANEL
CHOLESTEROL TOTAL: 206 mg/dL — AB (ref 100–199)
Chol/HDL Ratio: 3 ratio (ref 0.0–4.4)
HDL: 69 mg/dL (ref >39)
LDL Calculated: 122 mg/dL — ABNORMAL HIGH (ref 0–99)
TRIGLYCERIDES: 75 mg/dL (ref 0–149)
VLDL Cholesterol Cal: 15 mg/dL (ref 5–40)

## 2017-05-20 ENCOUNTER — Telehealth: Payer: Self-pay | Admitting: Certified Nurse Midwife

## 2017-05-20 DIAGNOSIS — Z0289 Encounter for other administrative examinations: Secondary | ICD-10-CM | POA: Diagnosis not present

## 2017-05-20 DIAGNOSIS — Z111 Encounter for screening for respiratory tuberculosis: Secondary | ICD-10-CM | POA: Diagnosis not present

## 2017-05-20 NOTE — Telephone Encounter (Signed)
Spoke with patient and she wanted to know date of Tdap and if we did MMR or Hepatitis B vaccines. Date given for last Tdap and advised patient we did not give the other vaccines.

## 2017-05-20 NOTE — Telephone Encounter (Signed)
Patient is asking if she had any other vaccines besides Tdap done at our office? Patient is specifically asking if she had MMR and Hepatitis vaccines?  °

## 2017-06-18 DIAGNOSIS — Z23 Encounter for immunization: Secondary | ICD-10-CM | POA: Diagnosis not present

## 2017-07-24 DIAGNOSIS — M25561 Pain in right knee: Secondary | ICD-10-CM | POA: Diagnosis not present

## 2017-09-21 DIAGNOSIS — M25561 Pain in right knee: Secondary | ICD-10-CM

## 2017-09-21 DIAGNOSIS — M25562 Pain in left knee: Secondary | ICD-10-CM | POA: Insufficient documentation

## 2017-09-25 DIAGNOSIS — M25561 Pain in right knee: Secondary | ICD-10-CM | POA: Diagnosis not present

## 2017-09-29 DIAGNOSIS — M2241 Chondromalacia patellae, right knee: Secondary | ICD-10-CM | POA: Diagnosis not present

## 2017-09-29 DIAGNOSIS — M25561 Pain in right knee: Secondary | ICD-10-CM | POA: Diagnosis not present

## 2017-10-15 ENCOUNTER — Telehealth: Payer: Self-pay

## 2017-10-28 NOTE — Telephone Encounter (Signed)
Opened in error

## 2018-04-09 DIAGNOSIS — Z1231 Encounter for screening mammogram for malignant neoplasm of breast: Secondary | ICD-10-CM | POA: Diagnosis not present

## 2018-04-29 ENCOUNTER — Encounter: Payer: Self-pay | Admitting: Certified Nurse Midwife

## 2018-04-29 DIAGNOSIS — J069 Acute upper respiratory infection, unspecified: Secondary | ICD-10-CM | POA: Diagnosis not present

## 2018-05-04 ENCOUNTER — Ambulatory Visit (INDEPENDENT_AMBULATORY_CARE_PROVIDER_SITE_OTHER): Payer: BLUE CROSS/BLUE SHIELD | Admitting: Certified Nurse Midwife

## 2018-05-04 ENCOUNTER — Other Ambulatory Visit: Payer: Self-pay

## 2018-05-04 ENCOUNTER — Other Ambulatory Visit (HOSPITAL_COMMUNITY)
Admission: RE | Admit: 2018-05-04 | Discharge: 2018-05-04 | Disposition: A | Discharge status: Home / Self Care | Payer: BLUE CROSS/BLUE SHIELD | Source: Ambulatory Visit | Attending: Certified Nurse Midwife | Admitting: Certified Nurse Midwife

## 2018-05-04 ENCOUNTER — Encounter: Payer: Self-pay | Admitting: Certified Nurse Midwife

## 2018-05-04 VITALS — BP 92/60 | HR 68 | Resp 16 | Ht 64.25 in | Wt 147.0 lb

## 2018-05-04 DIAGNOSIS — B373 Candidiasis of vulva and vagina: Secondary | ICD-10-CM

## 2018-05-04 DIAGNOSIS — Z01411 Encounter for gynecological examination (general) (routine) with abnormal findings: Secondary | ICD-10-CM

## 2018-05-04 DIAGNOSIS — B379 Candidiasis, unspecified: Secondary | ICD-10-CM | POA: Diagnosis not present

## 2018-05-04 DIAGNOSIS — N952 Postmenopausal atrophic vaginitis: Secondary | ICD-10-CM

## 2018-05-04 DIAGNOSIS — Z23 Encounter for immunization: Secondary | ICD-10-CM

## 2018-05-04 DIAGNOSIS — Z124 Encounter for screening for malignant neoplasm of cervix: Secondary | ICD-10-CM

## 2018-05-04 DIAGNOSIS — T3695XA Adverse effect of unspecified systemic antibiotic, initial encounter: Secondary | ICD-10-CM

## 2018-05-04 MED ORDER — FLUCONAZOLE 150 MG PO TABS: 150.0000 mg | ORAL_TABLET | Freq: Once | ORAL | 0 refills | Status: AC

## 2018-05-04 MED ORDER — ESTRADIOL 10 MCG VA TABS: ORAL_TABLET | VAGINAL | 1 refills | Status: DC

## 2018-05-04 NOTE — Progress Notes (Signed)
57 y.o. G1P1 Married  Caucasian Fe here for annual exam. Menopausal no HRT. Denies vaginal bleeding. Has been having vaginal dryness with sexual activity to the point of she has stopped.Interested in Estrogen option to help with dryness. Also treated recently for URI with antibiotic and feels like yeast occurring again.Please check. Sees PCP for Celexa/Trazadone management,labs and aex. No other health issues today.  Patient's last menstrual period was 04/21/2016.          Sexually active: Yes.    The current method  of family planning is post menopausal status.    Exercising: Yes.    yoga & walking Smoker:  no  Review of Systems  Constitutional: Negative.   HENT: Negative.   Eyes: Negative.   Respiratory: Negative.   Cardiovascular: Negative.   Gastrointestinal: Negative.   Genitourinary:       Vaginal dryness, vaginal irritation-took antibiotics that caused it per patient  Musculoskeletal: Negative.   Skin: Negative.   Neurological: Negative.   Endo/Heme/Allergies: Negative.   Psychiatric/Behavioral: Negative.     Health Maintenance: Pap:  04-15-16 neg HPV HR neg History of Abnormal Pap: no MMG:  04-09-18 category b density birads 2:neg Self Breast exams: yes Colonoscopy:  2016 polyps f/u 3541yrs BMD:   none TDaP:  2009 Shingles: no Pneumonia: no Hep C and HIV: Hep c neg 2017 Labs: if needed   reports that she has never smoked. She has never used smokeless tobacco. She reports that she does not drink alcohol or use drugs.  Past Medical History:  Diagnosis Date  . Anxiety    Over pelvic exams  . Arthritis 2006   Right Hip  . UTI (lower urinary tract infection)     Past Surgical History:  Procedure Laterality Date  . BIOPSY THYROID Left 03/2014  . CESAREAN SECTION  1992  . TONSILLECTOMY  1988    Current Outpatient Medications  Medication Sig Dispense Refill  . Acetaminophen (TYLENOL PO) Take by mouth as needed.    Marland Kitchen. azithromycin (ZITHROMAX) 250 MG tablet TAKE 1  TABLET BY MOUTH EVERY DAY FOR 5 DAYS  0  . citalopram (CELEXA) 20 MG tablet Take 1 tablet (20 mg total) by mouth daily. 90 tablet 3  . Multiple Vitamins-Minerals (MULTIVITAMIN PO) Take by mouth daily.     . Naproxen Sodium (ALEVE PO) Take by mouth as needed. Aleve    . traZODone (DESYREL) 50 MG tablet Take 50 mg by mouth at bedtime as needed.     . vitamin C (ASCORBIC ACID) 500 MG tablet Take 500 mg by mouth daily. Vitamin C     No current facility-administered medications for this visit.     History reviewed. No pertinent family history.  ROS:  Pertinent items are noted in HPI.  Otherwise, a comprehensive ROS was negative.  Exam:   BP 92/60   Pulse 68   Resp 16   Ht 5' 4.25" (1.632 m)   Wt 147 lb (66.7 kg)   LMP 04/21/2016   BMI 25.04 kg/m  Height: 5' 4.25" (163.2 cm) Ht Readings from Last 3 Encounters:  05/04/18 5' 4.25" (1.632 m)  04/21/17 5\' 4"  (1.626 m)  12/31/16 5' 4.25" (1.632 m)    General appearance: alert, cooperative and appears stated age Head: Normocephalic, without obvious abnormality, atraumatic Neck: no adenopathy, supple, symmetrical, trachea midline and thyroid normal to inspection and palpation Lungs: clear to auscultation bilaterally Breasts: normal appearance, no masses or tenderness, No nipple retraction or dimpling, No nipple discharge or  bleeding, No axillary or supraclavicular adenopathy Heart: regular rate and rhythm Abdomen: soft, non-tender; no masses,  no organomegaly Extremities: extremities normal, atraumatic, no cyanosis or edema Skin: Skin color, texture, turgor normal. No rashes or lesions Lymph nodes: Cervical, supraclavicular, and axillary nodes normal. No abnormal inguinal nodes palpated Neurologic: Grossly normal   Pelvic: External genitalia:  no lesions              Urethra:  normal appearing urethra with no masses, tenderness or lesions              Bartholin's and Skene's: normal                 Vagina: normal appearing vagina  with normal color and discharge, no lesions              Cervix: no cervical motion tenderness, no lesions and normal appearance              Pap taken: Yes.   Bimanual Exam:  Uterus:  normal size, contour, position, consistency, mobility, non-tender              Adnexa: normal adnexa and no mass, fullness, tenderness               Rectovaginal: Confirms               Anus:  normal sphincter tone, no lesions  Chaperone present: yes  A:  Well Woman with normal exam  Menopausal no HRT  Atrophic vaginitis  Antibiotic induced yeast vaginitis  Immunization update  P:   Reviewed health and wellness pertinent to exam  Aware of need to advise if vaginal bleeding  Discussed finding and risks/benefits/expectations with Estrogen products for vaginal atrophy. Patient would like to try Vagifem.   Rx Vagifem see order with instructions, patient does not want to use daily for more than one week. Discussed trial of one week and then twice weekly. Rv to evaluate in 2 months, prn. Agreeable to plan.  Discussed yeast vaginitis finding and recommend treatment.  Rx Diflucan see order with instructions  Request TDAP  Pap smear: yes   counseled on breast self exam, mammography screening, adequate intake of calcium and vitamin D, diet and exercise  return annually or prn T-Dap Given in Right Deltoid patient tolerated well.   An After Visit Summary was printed and given to the patient.

## 2018-05-04 NOTE — Patient Instructions (Signed)
EXERCISE AND DIET:  We recommended that you start or continue a regular exercise program for good health. Regular exercise means any activity that makes your heart beat faster and makes you sweat.  We recommend exercising at least 30 minutes per day at least 3 days a week, preferably 4 or 5.  We also recommend a diet low in fat and sugar.  Inactivity, poor dietary choices and obesity can cause diabetes, heart attack, stroke, and kidney damage, among others.    ALCOHOL AND SMOKING:  Women should limit their alcohol intake to no more than 7 drinks/beers/glasses of wine (combined, not each!) per week. Moderation of alcohol intake to this level decreases your risk of breast cancer and liver damage. And of course, no recreational drugs are part of a healthy lifestyle.  And absolutely no smoking or even second hand smoke. Most people know smoking can cause heart and lung diseases, but did you know it also contributes to weakening of your bones? Aging of your skin?  Yellowing of your teeth and nails?  CALCIUM AND VITAMIN D:  Adequate intake of calcium and Vitamin D are recommended.  The recommendations for exact amounts of these supplements seem to change often, but generally speaking 600 mg of calcium (either carbonate or citrate) and 800 units of Vitamin D per day seems prudent. Certain women may benefit from higher intake of Vitamin D.  If you are among these women, your doctor will have told you during your visit.    PAP SMEARS:  Pap smears, to check for cervical cancer or precancers,  have traditionally been done yearly, although recent scientific advances have shown that most women can have pap smears less often.  However, every woman still should have a physical exam from her gynecologist every year. It will include a breast check, inspection of the vulva and vagina to check for abnormal growths or skin changes, a visual exam of the cervix, and then an exam to evaluate the size and shape of the uterus and  ovaries.  And after 56 years of age, a rectal exam is indicated to check for rectal cancers. We will also provide age appropriate advice regarding health maintenance, like when you should have certain vaccines, screening for sexually transmitted diseases, bone density testing, colonoscopy, mammograms, etc.   MAMMOGRAMS:  All women over 40 years old should have a yearly mammogram. Many facilities now offer a "3D" mammogram, which may cost around $50 extra out of pocket. If possible,  we recommend you accept the option to have the 3D mammogram performed.  It both reduces the number of women who will be called back for extra views which then turn out to be normal, and it is better than the routine mammogram at detecting truly abnormal areas.    COLONOSCOPY:  Colonoscopy to screen for colon cancer is recommended for all women at age 50.  We know, you hate the idea of the prep.  We agree, BUT, having colon cancer and not knowing it is worse!!  Colon cancer so often starts as a polyp that can be seen and removed at colonscopy, which can quite literally save your life!  And if your first colonoscopy is normal and you have no family history of colon cancer, most women don't have to have it again for 10 years.  Once every ten years, you can do something that may end up saving your life, right?  We will be happy to help you get it scheduled when you are ready.    Be sure to check your insurance coverage so you understand how much it will cost.  It may be covered as a preventative service at no cost, but you should check your particular policy.      Atrophic Vaginitis Atrophic vaginitis is when the tissues that line the vagina become dry and thin. This is caused by a drop in estrogen. Estrogen helps:  To keep the vagina moist.  To make a clear fluid that helps: ? To lubricate the vagina for sex. ? To protect the vagina from infection.  If the lining of the vagina is dry and thin, it may:  Make sex painful. It  may also cause bleeding.  Cause a feeling of: ? Burning. ? Irritation. ? Itchiness.  Make an exam of your vagina painful. It may also cause bleeding.  Make you lose interest in sex.  Cause a burning feeling when you pee.  Make your vaginal fluid (discharge) brown or yellow.  For some women, there are no symptoms. This condition is most common in women who do not get their regular menstrual periods anymore (menopause). This often starts when a woman is 6145-56 years old. Follow these instructions at home:  Take medicines only as told by your doctor. Do not use any herbal or alternative medicines unless your doctor says it is okay.  Use over-the-counter products for dryness only as told by your doctor. These include: ? Creams. ? Lubricants. ? Moisturizers.  Do not douche.  Do not use products that can make your vagina dry. These include: ? Scented feminine sprays. ? Scented tampons. ? Scented soaps.  If it hurts to have sex, tell your sexual partner. Contact a doctor if:  Your discharge looks different than normal.  Your vagina has an unusual smell.  You have new symptoms.  Your symptoms do not get better with treatment.  Your symptoms get worse. This information is not intended to replace advice given to you by your health care provider. Make sure you discuss any questions you have with your health care provider. Document Released: 02/18/2008 Document Revised: 02/07/2016 Document Reviewed: 08/23/2014 Elsevier Interactive Patient Education  2018 [employer]  Vaginal Yeast infection, Adult Vaginal yeast infection is a condition that causes soreness, swelling, and redness (inflammation) of the vagina. It also causes vaginal discharge. This is a common condition. Some women get this infection frequently. What are the causes? This condition is caused by a change in the normal balance of the yeast (candida) and bacteria that live in the vagina. This change causes an  overgrowth of yeast, which causes the inflammation. What increases the risk? This condition is more likely to develop in:  Women who take antibiotic medicines.  Women who have diabetes.  Women who take birth control pills.  Women who are pregnant.  Women who douche often.  Women who have a weak defense (immune) system.  Women who have been taking steroid medicines for a long time.  Women who frequently wear tight clothing.  What are the signs or symptoms? Symptoms of this condition include:  White, thick vaginal discharge.  Swelling, itching, redness, and irritation of the vagina. The lips of the vagina (vulva) may be affected as well.  Pain or a burning feeling while urinating.  Pain during sex.  How is this diagnosed? This condition is diagnosed with a medical history and physical exam. This will include a pelvic exam. Your health care provider will examine a sample of your vaginal discharge under a microscope. Your health  care provider may send this sample for testing to confirm the diagnosis. How is this treated? This condition is treated with medicine. Medicines may be over-the-counter or prescription. You may be told to use one or more of the following:  Medicine that is taken orally.  Medicine that is applied as a cream.  Medicine that is inserted directly into the vagina (suppository).  Follow these instructions at home:  Take or apply over-the-counter and prescription medicines only as told by your health care provider.  Do not have sex until your health care provider has approved. Tell your sex partner that you have a yeast infection. That person should go to his or her health care provider if he or she develops symptoms.  Do not wear tight clothes, such as pantyhose or tight pants.  Avoid using tampons until your health care provider approves.  Eat more yogurt. This may help to keep your yeast infection from returning.  Try taking a sitz bath to help  with discomfort. This is a warm water bath that is taken while you are sitting down. The water should only come up to your hips and should cover your buttocks. Do this 3-4 times per day or as told by your health care provider.  Do not douche.  Wear breathable, cotton underwear.  If you have diabetes, keep your blood sugar levels under control. Contact a health care provider if:  You have a fever.  Your symptoms go away and then return.  Your symptoms do not get better with treatment.  Your symptoms get worse.  You have new symptoms.  You develop blisters in or around your vagina.  You have blood coming from your vagina and it is not your menstrual period.  You develop pain in your abdomen. This information is not intended to replace advice given to you by your health care provider. Make sure you discuss any questions you have with your health care provider. Document Released: 06/11/2005 Document Revised: 02/13/2016 Document Reviewed: 03/05/2015 Elsevier Interactive Patient Education  2018 [employer]

## 2018-05-06 LAB — CYTOLOGY - PAP
Diagnosis: NEGATIVE
HPV (WINDOPATH): NOT DETECTED

## 2018-05-13 ENCOUNTER — Telehealth: Payer: Self-pay | Admitting: Certified Nurse Midwife

## 2018-05-13 NOTE — Telephone Encounter (Signed)
Patient canceled her upcoming 2.5 month recheck appointment 07/16/2018. Patient states she did not start the estrogen, has decided against taking. .Marland Kitchen

## 2018-05-19 NOTE — Telephone Encounter (Signed)
Ok to close

## 2018-05-19 NOTE — Telephone Encounter (Addendum)
Okay to close encounter?  °

## 2018-06-21 DIAGNOSIS — Z23 Encounter for immunization: Secondary | ICD-10-CM | POA: Diagnosis not present

## 2018-07-07 ENCOUNTER — Other Ambulatory Visit: Payer: Self-pay | Admitting: Certified Nurse Midwife

## 2018-07-07 DIAGNOSIS — F411 Generalized anxiety disorder: Secondary | ICD-10-CM

## 2018-07-07 NOTE — Telephone Encounter (Signed)
Medication refill request: celexa 20 mg  Last AEX:  05/04/18 Next AEX: 05/06/19 Last MMG (if hormonal medication request):  Refill authorized: Bi-rads 2 Benign

## 2018-07-16 ENCOUNTER — Ambulatory Visit: Payer: BLUE CROSS/BLUE SHIELD | Admitting: Certified Nurse Midwife

## 2018-12-30 DIAGNOSIS — F4322 Adjustment disorder with anxiety: Secondary | ICD-10-CM | POA: Diagnosis not present

## 2019-01-14 DIAGNOSIS — F4322 Adjustment disorder with anxiety: Secondary | ICD-10-CM | POA: Diagnosis not present

## 2019-01-31 DIAGNOSIS — F4322 Adjustment disorder with anxiety: Secondary | ICD-10-CM | POA: Diagnosis not present

## 2019-04-12 DIAGNOSIS — M2241 Chondromalacia patellae, right knee: Secondary | ICD-10-CM | POA: Diagnosis not present

## 2019-04-12 DIAGNOSIS — M25562 Pain in left knee: Secondary | ICD-10-CM | POA: Diagnosis not present

## 2019-04-12 DIAGNOSIS — M25561 Pain in right knee: Secondary | ICD-10-CM | POA: Diagnosis not present

## 2019-04-15 DIAGNOSIS — G479 Sleep disorder, unspecified: Secondary | ICD-10-CM | POA: Diagnosis not present

## 2019-04-18 DIAGNOSIS — M94261 Chondromalacia, right knee: Secondary | ICD-10-CM | POA: Diagnosis not present

## 2019-04-20 DIAGNOSIS — M94261 Chondromalacia, right knee: Secondary | ICD-10-CM | POA: Diagnosis not present

## 2019-04-22 ENCOUNTER — Encounter: Payer: Self-pay | Admitting: Certified Nurse Midwife

## 2019-04-22 DIAGNOSIS — Z1231 Encounter for screening mammogram for malignant neoplasm of breast: Secondary | ICD-10-CM | POA: Diagnosis not present

## 2019-04-27 DIAGNOSIS — M94261 Chondromalacia, right knee: Secondary | ICD-10-CM | POA: Diagnosis not present

## 2019-05-04 DIAGNOSIS — M94261 Chondromalacia, right knee: Secondary | ICD-10-CM | POA: Diagnosis not present

## 2019-05-06 ENCOUNTER — Ambulatory Visit (INDEPENDENT_AMBULATORY_CARE_PROVIDER_SITE_OTHER): Payer: BC Managed Care – PPO | Admitting: Certified Nurse Midwife

## 2019-05-06 ENCOUNTER — Encounter: Payer: Self-pay | Admitting: Certified Nurse Midwife

## 2019-05-06 ENCOUNTER — Other Ambulatory Visit: Payer: Self-pay

## 2019-05-06 VITALS — BP 108/62 | HR 64 | Temp 97.2°F | Resp 16 | Ht 63.75 in | Wt 153.0 lb

## 2019-05-06 DIAGNOSIS — R3915 Urgency of urination: Secondary | ICD-10-CM

## 2019-05-06 DIAGNOSIS — E559 Vitamin D deficiency, unspecified: Secondary | ICD-10-CM

## 2019-05-06 DIAGNOSIS — Z0000 Encounter for general adult medical examination without abnormal findings: Secondary | ICD-10-CM | POA: Diagnosis not present

## 2019-05-06 DIAGNOSIS — Z01419 Encounter for gynecological examination (general) (routine) without abnormal findings: Secondary | ICD-10-CM

## 2019-05-06 LAB — POCT URINALYSIS DIPSTICK
Bilirubin, UA: NEGATIVE
Blood, UA: NEGATIVE
Glucose, UA: NEGATIVE
Ketones, UA: NEGATIVE
Leukocytes, UA: NEGATIVE
Nitrite, UA: NEGATIVE
Protein, UA: NEGATIVE
Urobilinogen, UA: NEGATIVE E.U./dL — AB
pH, UA: 5 (ref 5.0–8.0)

## 2019-05-06 NOTE — Progress Notes (Addendum)
58 y.o. G1P1 Married  Caucasian Fe here for annual exam. Menopausal no HRT. Urinary frequency but once at hs. Some urgency, consuming enough water daily. No fever, chills or back pain.  No vaginal bleeding. Some night sweats, sleeping with no issues. Celexa still working well for anxiety/depression. Sees PCP for medication management of Trazadone. No other health issues today.  Patient's last menstrual period was 04/21/2016.          Sexually active: Yes.    The current method of family planning is post menopausal status.    Exercising: Yes.    walking & yoga Smoker:  no  Review of Systems  Constitutional:       Night sweats, hot flashes  HENT: Negative.   Eyes: Negative.   Respiratory: Negative.   Cardiovascular: Negative.   Gastrointestinal: Negative.   Genitourinary: Positive for frequency and urgency.  Musculoskeletal: Negative.   Skin: Negative.   Neurological: Negative.   Endo/Heme/Allergies: Negative.   Psychiatric/Behavioral: Negative.     Health Maintenance: Pap:  05-04-2018 neg HPV HR neg History of Abnormal Pap: no MMG:  04-09-18 category c density birads 2:neg, done last Friday neg per patient Self Breast exams: yes Colonoscopy:  2016 polyps f/u 1yrs BMD:   none TDaP:  2019 Shingles: no Pneumonia: no Hep C and HIV: Hep c neg 2017 Labs: yes   reports that she has never smoked. She has never used smokeless tobacco. She reports that she does not drink alcohol or use drugs.  Past Medical History:  Diagnosis Date  . Anxiety    Over pelvic exams  . Arthritis 2006   Right Hip  . UTI (lower urinary tract infection)     Past Surgical History:  Procedure Laterality Date  . BIOPSY THYROID Left 03/2014  . CESAREAN SECTION  1992  . TONSILLECTOMY  1988    Current Outpatient Medications  Medication Sig Dispense Refill  . Acetaminophen (TYLENOL PO) Take by mouth as needed.    . citalopram (CELEXA) 20 MG tablet TAKE 1 TABLET BY MOUTH EVERY DAY 90 tablet 3  .  Multiple Vitamins-Minerals (MULTIVITAMIN PO) Take by mouth daily.     . Naproxen Sodium (ALEVE PO) Take by mouth as needed. Aleve    . traZODone (DESYREL) 50 MG tablet Take 50 mg by mouth at bedtime as needed.     . vitamin C (ASCORBIC ACID) 500 MG tablet Take 500 mg by mouth daily. Vitamin C     No current facility-administered medications for this visit.     History reviewed. No pertinent family history.  ROS:  Pertinent items are noted in HPI.  Otherwise, a comprehensive ROS was negative.  Exam:   BP 108/62   Pulse 64   Temp (!) 97.2 F (36.2 C) (Skin)   Resp 16   Ht 5' 3.75" (1.619 m)   Wt 153 lb (69.4 kg)   LMP 04/21/2016   BMI 26.47 kg/m  Height: 5' 3.75" (161.9 cm) Ht Readings from Last 3 Encounters:  05/06/19 5' 3.75" (1.619 m)  05/04/18 5' 4.25" (1.632 m)  04/21/17 5\' 4"  (1.626 m)    General appearance: alert, cooperative and appears stated age Head: Normocephalic, without obvious abnormality, atraumatic Neck: no adenopathy, supple, symmetrical, trachea midline and thyroid normal to inspection and palpation Lungs: clear to auscultation bilaterally CVAT bilateral negative Breasts: normal appearance, no masses or tenderness, No nipple retraction or dimpling, No nipple discharge or bleeding, No axillary or supraclavicular adenopathy Heart: regular rate and rhythm Abdomen:  soft, non-tender; no masses,  no organomegaly, negative suprapubic Extremities: extremities normal, atraumatic, no cyanosis or edema Skin: Skin color, texture, turgor normal. No rashes or lesions Lymph nodes: Cervical, supraclavicular, and axillary nodes normal. No abnormal inguinal nodes palpated Neurologic: Grossly normal   Pelvic: External genitalia:  no lesions              Urethra:  normal appearing urethra with no masses, tenderness or lesions, bladder, non tender, meatus slightly red, non tender              Bartholin's and Skene's: normal                 Vagina: normal appearing vagina  with normal color and discharge, no lesions              Cervix: no cervical motion tenderness, no lesions and normal appearance              Pap taken: No. Bimanual Exam:  Uterus:  normal size, contour, position, consistency, mobility, non-tender and anteverted              Adnexa: normal adnexa and no mass, fullness, tenderness               Rectovaginal: Confirms               Anus:  normal sphincter tone, no lesions  Chaperone present: yes  A:  Well Woman with normal exam  Menopausal no HRT  Urinary Urgency, normal exam  Anxiety/depression Celexa working well  Screening labs  P:   Reviewed health and wellness pertinent to exam  Aware of need to advise if vaginal bleeding  Discussed increasing water and taking cranberry capsule supplement twice daily to see if this changes. Will send urine culture and treatment if indicated.  Risks/benefits/warning signs of Celexa use reviewed. Desires continuance.  Rx Celexa see order with instructions.  Lab: urine culture, urine micro  Lab: CBC, Lipid panel, CMP, Vitamin D, TSH  Pap smear: no   counseled on breast self exam, mammography screening, feminine hygiene, adequate intake of calcium and vitamin D, diet and exercise,UTI warning signs given and need to advise if occurs.  return annually or prn  An After Visit Summary was printed and given to the patient.

## 2019-05-06 NOTE — Patient Instructions (Signed)

## 2019-05-06 NOTE — Addendum Note (Signed)
Addended by: Terence Lux A on: 05/06/2019 01:54 PM   Modules accepted: Orders

## 2019-05-07 LAB — CBC
Hematocrit: 39.9 % (ref 34.0–46.6)
Hemoglobin: 13.3 g/dL (ref 11.1–15.9)
MCH: 30.8 pg (ref 26.6–33.0)
MCHC: 33.3 g/dL (ref 31.5–35.7)
MCV: 92 fL (ref 79–97)
Platelets: 279 10*3/uL (ref 150–450)
RBC: 4.32 x10E6/uL (ref 3.77–5.28)
RDW: 13.2 % (ref 11.7–15.4)
WBC: 8.3 10*3/uL (ref 3.4–10.8)

## 2019-05-07 LAB — COMPREHENSIVE METABOLIC PANEL
ALT: 15 IU/L (ref 0–32)
AST: 19 IU/L (ref 0–40)
Albumin/Globulin Ratio: 1.8 (ref 1.2–2.2)
Albumin: 4.2 g/dL (ref 3.8–4.9)
Alkaline Phosphatase: 91 IU/L (ref 39–117)
BUN/Creatinine Ratio: 17 (ref 9–23)
BUN: 10 mg/dL (ref 6–24)
Bilirubin Total: 0.2 mg/dL (ref 0.0–1.2)
CO2: 23 mmol/L (ref 20–29)
Calcium: 9.4 mg/dL (ref 8.7–10.2)
Chloride: 98 mmol/L (ref 96–106)
Creatinine, Ser: 0.6 mg/dL (ref 0.57–1.00)
GFR calc Af Amer: 118 mL/min/{1.73_m2} (ref >59)
GFR calc non Af Amer: 102 mL/min/{1.73_m2} (ref >59)
Globulin, Total: 2.3 g/dL (ref 1.5–4.5)
Glucose: 89 mg/dL (ref 65–99)
Potassium: 4.6 mmol/L (ref 3.5–5.2)
Sodium: 134 mmol/L (ref 134–144)
Total Protein: 6.5 g/dL (ref 6.0–8.5)

## 2019-05-07 LAB — URINALYSIS, MICROSCOPIC ONLY
Bacteria, UA: NONE SEEN
Casts: NONE SEEN /lpf

## 2019-05-07 LAB — LIPID PANEL
Chol/HDL Ratio: 3 ratio (ref 0.0–4.4)
Cholesterol, Total: 221 mg/dL — ABNORMAL HIGH (ref 100–199)
HDL: 74 mg/dL (ref >39)
LDL Calculated: 128 mg/dL — ABNORMAL HIGH (ref 0–99)
Triglycerides: 94 mg/dL (ref 0–149)
VLDL Cholesterol Cal: 19 mg/dL (ref 5–40)

## 2019-05-07 LAB — TSH: TSH: 3.32 u[IU]/mL (ref 0.450–4.500)

## 2019-05-07 LAB — URINE CULTURE

## 2019-05-07 LAB — VITAMIN D 25 HYDROXY (VIT D DEFICIENCY, FRACTURES): Vit D, 25-Hydroxy: 34.5 ng/mL (ref 30.0–100.0)

## 2019-05-11 DIAGNOSIS — M94261 Chondromalacia, right knee: Secondary | ICD-10-CM | POA: Diagnosis not present

## 2019-05-18 DIAGNOSIS — M94261 Chondromalacia, right knee: Secondary | ICD-10-CM | POA: Diagnosis not present

## 2019-06-28 ENCOUNTER — Other Ambulatory Visit: Payer: Self-pay | Admitting: Certified Nurse Midwife

## 2019-06-28 DIAGNOSIS — F411 Generalized anxiety disorder: Secondary | ICD-10-CM

## 2019-06-28 NOTE — Telephone Encounter (Signed)
Medication refill request: Citalopram Last AEX:  05/06/19 DL Next AEX: 05/07/20 Last MMG (if hormonal medication request): 04/22/19 BIRADS 1 negative/density c Refill authorized: Please advise; Order pended #90 w/3 refills if authorized

## 2019-07-19 DIAGNOSIS — L988 Other specified disorders of the skin and subcutaneous tissue: Secondary | ICD-10-CM | POA: Diagnosis not present

## 2019-07-19 DIAGNOSIS — L658 Other specified nonscarring hair loss: Secondary | ICD-10-CM | POA: Diagnosis not present

## 2019-07-19 DIAGNOSIS — L821 Other seborrheic keratosis: Secondary | ICD-10-CM | POA: Diagnosis not present

## 2019-08-03 ENCOUNTER — Other Ambulatory Visit: Payer: Self-pay

## 2019-08-03 DIAGNOSIS — Z20822 Contact with and (suspected) exposure to covid-19: Secondary | ICD-10-CM

## 2019-08-04 LAB — NOVEL CORONAVIRUS, NAA: SARS-CoV-2, NAA: NOT DETECTED

## 2019-10-14 DIAGNOSIS — W57XXXA Bitten or stung by nonvenomous insect and other nonvenomous arthropods, initial encounter: Secondary | ICD-10-CM | POA: Diagnosis not present

## 2019-10-14 DIAGNOSIS — S60460A Insect bite (nonvenomous) of right index finger, initial encounter: Secondary | ICD-10-CM | POA: Diagnosis not present

## 2019-11-12 ENCOUNTER — Ambulatory Visit: Payer: Self-pay | Attending: Internal Medicine

## 2019-12-05 ENCOUNTER — Encounter: Payer: Self-pay | Admitting: Certified Nurse Midwife

## 2019-12-21 DIAGNOSIS — K30 Functional dyspepsia: Secondary | ICD-10-CM | POA: Diagnosis not present

## 2019-12-21 DIAGNOSIS — E559 Vitamin D deficiency, unspecified: Secondary | ICD-10-CM | POA: Diagnosis not present

## 2019-12-21 DIAGNOSIS — F419 Anxiety disorder, unspecified: Secondary | ICD-10-CM | POA: Diagnosis not present

## 2019-12-21 DIAGNOSIS — R110 Nausea: Secondary | ICD-10-CM | POA: Diagnosis not present

## 2019-12-21 DIAGNOSIS — N951 Menopausal and female climacteric states: Secondary | ICD-10-CM | POA: Diagnosis not present

## 2020-01-04 DIAGNOSIS — E559 Vitamin D deficiency, unspecified: Secondary | ICD-10-CM | POA: Diagnosis not present

## 2020-01-04 DIAGNOSIS — Z0000 Encounter for general adult medical examination without abnormal findings: Secondary | ICD-10-CM | POA: Diagnosis not present

## 2020-01-04 DIAGNOSIS — G4700 Insomnia, unspecified: Secondary | ICD-10-CM | POA: Diagnosis not present

## 2020-01-04 DIAGNOSIS — F419 Anxiety disorder, unspecified: Secondary | ICD-10-CM | POA: Diagnosis not present

## 2020-01-27 DIAGNOSIS — J40 Bronchitis, not specified as acute or chronic: Secondary | ICD-10-CM | POA: Diagnosis not present

## 2020-02-08 DIAGNOSIS — H52203 Unspecified astigmatism, bilateral: Secondary | ICD-10-CM | POA: Diagnosis not present

## 2020-02-08 DIAGNOSIS — H5213 Myopia, bilateral: Secondary | ICD-10-CM | POA: Diagnosis not present

## 2020-02-08 DIAGNOSIS — H04123 Dry eye syndrome of bilateral lacrimal glands: Secondary | ICD-10-CM | POA: Diagnosis not present

## 2020-04-25 DIAGNOSIS — Z1231 Encounter for screening mammogram for malignant neoplasm of breast: Secondary | ICD-10-CM | POA: Diagnosis not present

## 2020-05-07 ENCOUNTER — Encounter: Payer: Self-pay | Admitting: Obstetrics and Gynecology

## 2020-05-07 ENCOUNTER — Ambulatory Visit: Payer: BC Managed Care – PPO | Admitting: Certified Nurse Midwife

## 2020-05-07 ENCOUNTER — Other Ambulatory Visit: Payer: Self-pay

## 2020-05-07 ENCOUNTER — Ambulatory Visit (INDEPENDENT_AMBULATORY_CARE_PROVIDER_SITE_OTHER): Payer: BC Managed Care – PPO | Admitting: Obstetrics and Gynecology

## 2020-05-07 VITALS — BP 124/72 | HR 76 | Ht 64.0 in | Wt 153.8 lb

## 2020-05-07 DIAGNOSIS — N941 Unspecified dyspareunia: Secondary | ICD-10-CM | POA: Diagnosis not present

## 2020-05-07 DIAGNOSIS — Z01419 Encounter for gynecological examination (general) (routine) without abnormal findings: Secondary | ICD-10-CM | POA: Diagnosis not present

## 2020-05-07 DIAGNOSIS — N952 Postmenopausal atrophic vaginitis: Secondary | ICD-10-CM

## 2020-05-07 DIAGNOSIS — K649 Unspecified hemorrhoids: Secondary | ICD-10-CM

## 2020-05-07 MED ORDER — ESTRADIOL 10 MCG VA TABS: ORAL_TABLET | VAGINAL | 4 refills | Status: DC

## 2020-05-07 NOTE — Patient Instructions (Addendum)
EXERCISE AND DIET:  We recommended that you start or continue a regular exercise program for good health. Regular exercise means any activity that makes your heart beat faster and makes you sweat.  We recommend exercising at least 30 minutes per day at least 3 days a week, preferably 4 or 5.  We also recommend a diet low in fat and sugar.  Inactivity, poor dietary choices and obesity can cause diabetes, heart attack, stroke, and kidney damage, among others.    ALCOHOL AND SMOKING:  Women should limit their alcohol intake to no more than 7 drinks/beers/glasses of wine (combined, not each!) per week. Moderation of alcohol intake to this level decreases your risk of breast cancer and liver damage. And of course, no recreational drugs are part of a healthy lifestyle.  And absolutely no smoking or even second hand smoke. Most people know smoking can cause heart and lung diseases, but did you know it also contributes to weakening of your bones? Aging of your skin?  Yellowing of your teeth and nails?  CALCIUM AND VITAMIN D:  Adequate intake of calcium and Vitamin D are recommended.  The recommendations for exact amounts of these supplements seem to change often, but generally speaking 1,200 mg of calcium (between diet and supplement) and 800 units of Vitamin D per day seems prudent. Certain women may benefit from higher intake of Vitamin D.  If you are among these women, your doctor will have told you during your visit.    PAP SMEARS:  Pap smears, to check for cervical cancer or precancers,  have traditionally been done yearly, although recent scientific advances have shown that most women can have pap smears less often.  However, every woman still should have a physical exam from her gynecologist every year. It will include a breast check, inspection of the vulva and vagina to check for abnormal growths or skin changes, a visual exam of the cervix, and then an exam to evaluate the size and shape of the uterus and  ovaries.  And after 58 years of age, a rectal exam is indicated to check for rectal cancers. We will also provide age appropriate advice regarding health maintenance, like when you should have certain vaccines, screening for sexually transmitted diseases, bone density testing, colonoscopy, mammograms, etc.   MAMMOGRAMS:  All women over 40 years old should have a yearly mammogram. Many facilities now offer a "3D" mammogram, which may cost around $50 extra out of pocket. If possible,  we recommend you accept the option to have the 3D mammogram performed.  It both reduces the number of women who will be called back for extra views which then turn out to be normal, and it is better than the routine mammogram at detecting truly abnormal areas.    COLON CANCER SCREENING: Now recommend starting at age 45. At this time colonoscopy is not covered for routine screening until 50. There are take home tests that can be done between 45-49.   COLONOSCOPY:  Colonoscopy to screen for colon cancer is recommended for all women at age 50.  We know, you hate the idea of the prep.  We agree, BUT, having colon cancer and not knowing it is worse!!  Colon cancer so often starts as a polyp that can be seen and removed at colonscopy, which can quite literally save your life!  And if your first colonoscopy is normal and you have no family history of colon cancer, most women don't have to have it again for   10 years.  Once every ten years, you can do something that may end up saving your life, right?  We will be happy to help you get it scheduled when you are ready.  Be sure to check your insurance coverage so you understand how much it will cost.  It may be covered as a preventative service at no cost, but you should check your particular policy.      Breast Self-Awareness Breast self-awareness means being familiar with how your breasts look and feel. It involves checking your breasts regularly and reporting any changes to your  health care provider. Practicing breast self-awareness is important. A change in your breasts can be a sign of a serious medical problem. Being familiar with how your breasts look and feel allows you to find any problems early, when treatment is more likely to be successful. All women should practice breast self-awareness, including women who have had breast implants. How to do a breast self-exam One way to learn what is normal for your breasts and whether your breasts are changing is to do a breast self-exam. To do a breast self-exam: Look for Changes  1. Remove all the clothing above your waist. 2. Stand in front of a mirror in a room with good lighting. 3. Put your hands on your hips. 4. Push your hands firmly downward. 5. Compare your breasts in the mirror. Look for differences between them (asymmetry), such as: ? Differences in shape. ? Differences in size. ? Puckers, dips, and bumps in one breast and not the other. 6. Look at each breast for changes in your skin, such as: ? Redness. ? Scaly areas. 7. Look for changes in your nipples, such as: ? Discharge. ? Bleeding. ? Dimpling. ? Redness. ? A change in position. Feel for Changes Carefully feel your breasts for lumps and changes. It is best to do this while lying on your back on the floor and again while sitting or standing in the shower or tub with soapy water on your skin. Feel each breast in the following way:  Place the arm on the side of the breast you are examining above your head.  Feel your breast with the other hand.  Start in the nipple area and make  inch (2 cm) overlapping circles to feel your breast. Use the pads of your three middle fingers to do this. Apply light pressure, then medium pressure, then firm pressure. The light pressure will allow you to feel the tissue closest to the skin. The medium pressure will allow you to feel the tissue that is a little deeper. The firm pressure will allow you to feel the tissue  close to the ribs.  Continue the overlapping circles, moving downward over the breast until you feel your ribs below your breast.  Move one finger-width toward the center of the body. Continue to use the  inch (2 cm) overlapping circles to feel your breast as you move slowly up toward your collarbone.  Continue the up and down exam using all three pressures until you reach your armpit.  Write Down What You Find  Write down what is normal for each breast and any changes that you find. Keep a written record with breast changes or normal findings for each breast. By writing this information down, you do not need to depend only on memory for size, tenderness, or location. Write down where you are in your menstrual cycle, if you are still menstruating. If you are having trouble noticing differences   in your breasts, do not get discouraged. With time you will become more familiar with the variations in your breasts and more comfortable with the exam. How often should I examine my breasts? Examine your breasts every month. If you are breastfeeding, the best time to examine your breasts is after a feeding or after using a breast pump. If you menstruate, the best time to examine your breasts is 5-7 days after your period is over. During your period, your breasts are lumpier, and it may be more difficult to notice changes. When should I see my health care provider? See your health care provider if you notice:  A change in shape or size of your breasts or nipples.  A change in the skin of your breast or nipples, such as a reddened or scaly area.  Unusual discharge from your nipples.  A lump or thick area that was not there before.  Pain in your breasts.  Anything that concerns you.   Arthritis Arthritis is a term that is commonly used to refer to joint pain or joint disease. There are more than 100 types of arthritis. What are the causes? The most common cause of this condition is wear and tear  of a joint. Other causes include:  Gout.  Inflammation of a joint.  An infection of a joint.  Sprains and other injuries near the joint.  A reaction to medicines or drugs, or an allergic reaction. In some cases, the cause may not be known. What are the signs or symptoms? The main symptom of this condition is pain in the joint during movement. Other symptoms include:  Redness, swelling, or stiffness at a joint.  Warmth coming from the joint.  Fever.  Overall feeling of illness. How is this diagnosed? This condition may be diagnosed with a physical exam and tests, including:  Blood tests.  Urine tests.  Imaging tests, such as X-rays, an MRI, or a CT scan. Sometimes, fluid is removed from a joint for testing. How is this treated? This condition may be treated with:  Treatment of the cause, if it is known.  Rest.  Raising (elevating) the joint.  Applying cold or hot packs to the joint.  Medicines to improve symptoms and reduce inflammation.  Injections of a steroid such as cortisone into the joint to help reduce pain and inflammation. Depending on the cause of your arthritis, you may need to make lifestyle changes to reduce stress on your joint. Changes may include:  Exercising more.  Losing weight. Follow these instructions at home: Medicines  Take over-the-counter and prescription medicines only as told by your health care provider.  Do not take aspirin to relieve pain if your health care provider thinks that gout may be causing your pain. Activity  Rest your joint if told by your health care provider. Rest is important when your disease is active and your joint feels painful, swollen, or stiff.  Avoid activities that make the pain worse. It is important to balance activity with rest.  Exercise your joint regularly with range-of-motion exercises as told by your health care provider. Try doing low-impact exercise, such as: ? Swimming. ? Water  aerobics. ? Biking. ? Walking. Managing pain, stiffness, and swelling      If directed, put ice on the joint. ? Put ice in a plastic bag. ? Place a towel between your skin and the bag. ? Leave the ice on for 20 minutes, 2-3 times per day.  If your joint is swollen, raise (  elevate) it above the level of your heart if directed by your health care provider.  If your joint feels stiff in the morning, try taking a warm shower.  If directed, apply heat to the affected area as often as told by your health care provider. Use the heat source that your health care provider recommends, such as a moist heat pack or a heating pad. If you have diabetes, do not apply heat without permission from your health care provider. To apply heat: ? Place a towel between your skin and the heat source. ? Leave the heat on for 20-30 minutes. ? Remove the heat if your skin turns bright red. This is especially important if you are unable to feel pain, heat, or cold. You may have a greater risk of getting burned. General instructions  Do not use any products that contain nicotine or tobacco, such as cigarettes, e-cigarettes, and chewing tobacco. If you need help quitting, ask your health care provider.  Keep all follow-up visits as told by your health care provider. This is important. Contact a health care provider if:  The pain gets worse.  You have a fever. Get help right away if:  You develop severe joint pain, swelling, or redness.  Many joints become painful and swollen.  You develop severe back pain.  You develop severe weakness in your leg.  You cannot control your bladder or bowels. Summary  Arthritis is a term that is commonly used to refer to joint pain or joint disease. There are more than 100 types of arthritis.  The most common cause of this condition is wear and tear of a joint. Other causes include gout, inflammation or infection of the joint, sprains, or allergies.  Symptoms of this  condition include redness, swelling, or stiffness of the joint. Other symptoms include warmth, fever, or feeling ill.  This condition is treated with rest, elevation, medicines, and applying cold or hot packs.  Follow your health care provider's instructions about medicines, activity, exercises, and other home care treatments. This information is not intended to replace advice given to you by your health care provider. Make sure you discuss any questions you have with your health care provider. Document Revised: 08/09/2018 Document Reviewed: 08/09/2018 Elsevier Patient Education  2020 [employer]  Atrophic Vaginitis  Atrophic vaginitis is a condition in which the tissues that line the vagina become dry and thin. This condition is most common in women who have stopped having regular menstrual periods (are in menopause). This usually starts when a woman is 4-88 years old. That is the time when a woman's estrogen levels begin to drop (decrease). Estrogen is a female hormone. It helps to keep the tissues of the vagina moist. It stimulates the vagina to produce a clear fluid that lubricates the vagina for sexual intercourse. This fluid also protects the vagina from infection. Lack of estrogen can cause the lining of the vagina to get thinner and dryer. The vagina may also shrink in size. It may become less elastic. Atrophic vaginitis tends to get worse over time as a woman's estrogen level drops. What are the causes? This condition is caused by the normal drop in estrogen that happens around the time of menopause. What increases the risk? Certain conditions or situations may lower a woman's estrogen level, leading to a higher risk for atrophic vaginitis. You are more likely to develop this condition if:  You are taking medicines that block estrogen.  You have had your ovaries removed.  You  are being treated for cancer with X-ray (radiation) or medicines (chemotherapy).  You have given birth or  are breastfeeding.  You are older than age 55.  You smoke. What are the signs or symptoms? Symptoms of this condition include:  Pain, soreness, or bleeding during sexual intercourse (dyspareunia).  Vaginal burning, irritation, or itching.  Pain or bleeding when a speculum is used in a vaginal exam (pelvic exam).  Having burning pain when passing urine.  Vaginal discharge that is brown or yellow. In some cases, there are no symptoms. How is this diagnosed? This condition is diagnosed by taking a medical history and doing a physical exam. This will include a pelvic exam that checks the vaginal tissues. Though rare, you may also have other tests, including:  A urine test.  A test that checks the acid balance in your vagina (acid balance test). How is this treated? Treatment for this condition depends on how severe your symptoms are. Treatment may include:  Using an over-the-counter vaginal lubricant before sex.  Using a long-acting vaginal moisturizer.  Using low-dose vaginal estrogen for moderate to severe symptoms that do not respond to other treatments. Options include creams, tablets, and inserts (vaginal rings). Before you use a vaginal estrogen, tell your health care provider if you have a history of: ? Breast cancer. ? Endometrial cancer. ? Blood clots. If you are not sexually active and your symptoms are very mild, you may not need treatment. Follow these instructions at home: Medicines  Take over-the-counter and prescription medicines only as told by your health care provider. Do not use herbal or alternative medicines unless your health care provider says that you can.  Use over-the-counter creams, lubricants, or moisturizers for dryness only as directed by your health care provider. General instructions  If your atrophic vaginitis is caused by menopause, discuss all of your menopause symptoms and treatment options with your health care provider.  Do not  douche.  Do not use products that can make your vagina dry. These include: ? Scented feminine sprays. ? Scented tampons. ? Scented soaps.  Vaginal intercourse can help to improve blood flow and elasticity of vaginal tissue. If it hurts to have sex, try using a lubricant or moisturizer just before having intercourse. Contact a health care provider if:  Your discharge looks different than normal.  Your vagina has an unusual smell.  You have new symptoms.  Your symptoms do not improve with treatment.  Your symptoms get worse. Summary  Atrophic vaginitis is a condition in which the tissues that line the vagina become dry and thin. It is most common in women who have stopped having regular menstrual periods (are in menopause).  Treatment options include using vaginal lubricants and low-dose vaginal estrogen.  Contact a health care provider if your vagina has an unusual smell, or if your symptoms get worse or do not improve after treatment. This information is not intended to replace advice given to you by your health care provider. Make sure you discuss any questions you have with your health care provider. Document Revised: 08/14/2017 Document Reviewed: 05/28/2017 Elsevier Patient Education  2020 [employer].   Hemorrhoids Hemorrhoids are swollen veins in and around the rectum or anus. There are two types of hemorrhoids:  Internal hemorrhoids. These occur in the veins that are just inside the rectum. They may poke through to the outside and become irritated and painful.  External hemorrhoids. These occur in the veins that are outside the anus and can be felt as  a painful swelling or hard lump near the anus. Most hemorrhoids do not cause serious problems, and they can be managed with home treatments such as diet and lifestyle changes. If home treatments do not help the symptoms, procedures can be done to shrink or remove the hemorrhoids. What are the causes? This condition is  caused by increased pressure in the anal area. This pressure may result from various things, including:  Constipation.  Straining to have a bowel movement.  Diarrhea.  Pregnancy.  Obesity.  Sitting for long periods of time.  Heavy lifting or other activity that causes you to strain.  Anal sex.  Riding a bike for a long period of time. What are the signs or symptoms? Symptoms of this condition include:  Pain.  Anal itching or irritation.  Rectal bleeding.  Leakage of stool (feces).  Anal swelling.  One or more lumps around the anus. How is this diagnosed? This condition can often be diagnosed through a visual exam. Other exams or tests may also be done, such as:  An exam that involves feeling the rectal area with a gloved hand (digital rectal exam).  An exam of the anal canal that is done using a small tube (anoscope).  A blood test, if you have lost a significant amount of blood.  A test to look inside the colon using a flexible tube with a camera on the end (sigmoidoscopy or colonoscopy). How is this treated? This condition can usually be treated at home. However, various procedures may be done if dietary changes, lifestyle changes, and other home treatments do not help your symptoms. These procedures can help make the hemorrhoids smaller or remove them completely. Some of these procedures involve surgery, and others do not. Common procedures include:  Rubber band ligation. Rubber bands are placed at the base of the hemorrhoids to cut off their blood supply.  Sclerotherapy. Medicine is injected into the hemorrhoids to shrink them.  Infrared coagulation. A type of light energy is used to get rid of the hemorrhoids.  Hemorrhoidectomy surgery. The hemorrhoids are surgically removed, and the veins that supply them are tied off.  Stapled hemorrhoidopexy surgery. The surgeon staples the base of the hemorrhoid to the rectal wall. Follow these instructions at  home: Eating and drinking   Eat foods that have a lot of fiber in them, such as whole grains, beans, nuts, fruits, and vegetables.  Ask your health care provider about taking products that have added fiber (fiber supplements).  Reduce the amount of fat in your diet. You can do this by eating low-fat dairy products, eating less red meat, and avoiding processed foods.  Drink enough fluid to keep your urine pale yellow. Managing pain and swelling   Take warm sitz baths for 20 minutes, 3-4 times a day to ease pain and discomfort. You may do this in a bathtub or using a portable sitz bath that fits over the toilet.  If directed, apply ice to the affected area. Using ice packs between sitz baths may be helpful. ? Put ice in a plastic bag. ? Place a towel between your skin and the bag. ? Leave the ice on for 20 minutes, 2-3 times a day. General instructions  Take over-the-counter and prescription medicines only as told by your health care provider.  Use medicated creams or suppositories as told.  Get regular exercise. Ask your health care provider how much and what kind of exercise is best for you. In general, you should do moderate exercise  for at least 30 minutes on most days of the week (150 minutes each week). This can include activities such as walking, biking, or yoga.  Go to the bathroom when you have the urge to have a bowel movement. Do not wait.  Avoid straining to have bowel movements.  Keep the anal area dry and clean. Use wet toilet paper or moist towelettes after a bowel movement.  Do not sit on the toilet for long periods of time. This increases blood pooling and pain.  Keep all follow-up visits as told by your health care provider. This is important. Contact a health care provider if you have:  Increasing pain and swelling that are not controlled by treatment or medicine.  Difficulty having a bowel movement, or you are unable to have a bowel movement.  Pain or  inflammation outside the area of the hemorrhoids. Get help right away if you have:  Uncontrolled bleeding from your rectum. Summary  Hemorrhoids are swollen veins in and around the rectum or anus.  Most hemorrhoids can be managed with home treatments such as diet and lifestyle changes.  Taking warm sitz baths can help ease pain and discomfort.  In severe cases, procedures or surgery can be done to shrink or remove the hemorrhoids. This information is not intended to replace advice given to you by your health care provider. Make sure you discuss any questions you have with your health care provider. Document Revised: 01/28/2019 Document Reviewed: 01/21/2018 Elsevier Patient Education  2020 [employer].

## 2020-05-07 NOTE — Progress Notes (Signed)
59 y.o. G1P1 Married White or Caucasian Not Hispanic or Latino female here for annual exam. She is having vaginal dryness. She has pain with sex, getting worse over time. She has used a lubricant.     No vaginal bleeding. No bowel or bladder function.     Patient's last menstrual period was 04/21/2016.          Sexually active: Yes.    The current method of family planning is post menopausal status.    Exercising: Yes.    Yoga walking Smoker:  no  Health Maintenance: Pap:  05/04/18 neg Hr HPV Neg, 04/15/16 WNL Hr HPV Neg  History of abnormal Pap:  no MMG:04/27/20 Normal Report requested. BMD:   None  Colonoscopy: 2016 polyps follow up 5 years Has an appointment on Friday. TDaP:  2019 Gardasil: na   reports that she has never smoked. She has never used smokeless tobacco. She reports that she does not drink alcohol and does not use drugs. 61 year old daughter, local. No grandchildren. She is a Manufacturing systems engineer.   Past Medical History:  Diagnosis Date  . Anxiety    Over pelvic exams  . Arthritis 2006   Right Hip  . UTI (lower urinary tract infection)     Past Surgical History:  Procedure Laterality Date  . BIOPSY THYROID Left 03/2014  . CESAREAN SECTION  1992  . TONSILLECTOMY  1988    Current Outpatient Medications  Medication Sig Dispense Refill  . Acetaminophen (TYLENOL PO) Take by mouth as needed.    . citalopram (CELEXA) 20 MG tablet TAKE 1 TABLET BY MOUTH EVERY DAY 90 tablet 3  . Multiple Vitamins-Minerals (MULTIVITAMIN PO) Take by mouth daily.     . Naproxen Sodium (ALEVE PO) Take by mouth as needed. Aleve    . traZODone (DESYREL) 50 MG tablet Take 50 mg by mouth at bedtime as needed.     . vitamin C (ASCORBIC ACID) 500 MG tablet Take 500 mg by mouth daily. Vitamin C     No current facility-administered medications for this visit.    History reviewed. No pertinent family history.  Review of Systems  Genitourinary: Positive for vaginal pain.  All other systems  reviewed and are negative.   Exam:   BP 124/72   Pulse 76   Ht 5\' 4"  (1.626 m)   Wt 153 lb 12.8 oz (69.8 kg)   LMP 04/21/2016   SpO2 98%   BMI 26.40 kg/m   Weight change: @WEIGHTCHANGE @ Height:   Height: 5\' 4"  (162.6 cm)  Ht Readings from Last 3 Encounters:  05/07/20 5\' 4"  (1.626 m)  05/06/19 5' 3.75" (1.619 m)  05/04/18 5' 4.25" (1.632 m)    General appearance: alert, cooperative and appears stated age Head: Normocephalic, without obvious abnormality, atraumatic Neck: no adenopathy, supple, symmetrical, trachea midline and thyroid normal to inspection and palpation Lungs: clear to auscultation bilaterally Cardiovascular: regular rate and rhythm Breasts: normal appearance, no masses or tenderness Abdomen: soft, non-tender; non distended,  no masses,  no organomegaly Extremities: extremities normal, atraumatic, no cyanosis or edema Skin: Skin color, texture, turgor normal. No rashes or lesions Lymph nodes: Cervical, supraclavicular, and axillary nodes normal. No abnormal inguinal nodes palpated Neurologic: Grossly normal   Pelvic: External genitalia:  no lesions              Urethra:  normal appearing urethra with no masses, tenderness or lesions  Bartholins and Skenes: normal                 Vagina: normal appearing vagina with normal color and discharge, no lesions              Cervix: no lesions               Bimanual Exam:  Uterus:  normal size, contour, position, consistency, mobility, non-tender and anteverted              Adnexa: no mass, fullness, tenderness               Rectovaginal: Confirms               Anus:  normal sphincter tone, hemorrhoid noted  Carolynn Serve chaperoned for the exam.  A:  Well Woman with normal exam  Dyspareunia  Vaginal atrophy  Hemorrhoid, information given  P:   Pap up to date  Mammogram recently done, report requested  Colonoscopy scheduled  Discussed breast self exam  Discussed calcium and vit D intake  Labs with  primary

## 2020-07-06 DIAGNOSIS — Z1159 Encounter for screening for other viral diseases: Secondary | ICD-10-CM | POA: Diagnosis not present

## 2020-07-11 DIAGNOSIS — K5730 Diverticulosis of large intestine without perforation or abscess without bleeding: Secondary | ICD-10-CM | POA: Diagnosis not present

## 2020-07-11 DIAGNOSIS — K621 Rectal polyp: Secondary | ICD-10-CM | POA: Diagnosis not present

## 2020-07-11 DIAGNOSIS — Z86010 Personal history of colonic polyps: Secondary | ICD-10-CM | POA: Diagnosis not present

## 2020-09-05 ENCOUNTER — Other Ambulatory Visit: Payer: Self-pay

## 2020-09-05 ENCOUNTER — Ambulatory Visit: Payer: BC Managed Care – PPO | Admitting: Podiatry

## 2020-09-05 DIAGNOSIS — Z860100 Personal history of colon polyps, unspecified: Secondary | ICD-10-CM | POA: Insufficient documentation

## 2020-09-05 DIAGNOSIS — G479 Sleep disorder, unspecified: Secondary | ICD-10-CM | POA: Insufficient documentation

## 2020-09-05 DIAGNOSIS — M545 Low back pain, unspecified: Secondary | ICD-10-CM | POA: Insufficient documentation

## 2020-09-05 DIAGNOSIS — G5763 Lesion of plantar nerve, bilateral lower limbs: Secondary | ICD-10-CM

## 2020-09-05 DIAGNOSIS — M722 Plantar fascial fibromatosis: Secondary | ICD-10-CM | POA: Diagnosis not present

## 2020-09-05 DIAGNOSIS — M461 Sacroiliitis, not elsewhere classified: Secondary | ICD-10-CM | POA: Insufficient documentation

## 2020-09-05 DIAGNOSIS — E559 Vitamin D deficiency, unspecified: Secondary | ICD-10-CM | POA: Insufficient documentation

## 2020-09-05 NOTE — Progress Notes (Signed)
   Subjective: 59 y.o. female presenting as a new patient for intermittent foot pain is been going on for several years off and on now.  She states that she has had chronic foot pain and fatigue.  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  She presents for further treatment evaluation   Past Medical History:  Diagnosis Date  . Anxiety    Over pelvic exams  . Arthritis 2006   Right Hip  . UTI (lower urinary tract infection)      Objective: Physical Exam General: The patient is alert and oriented x3 in no acute distress.  Dermatology: Skin is warm, dry and supple bilateral lower extremities. Negative for open lesions or macerations bilateral.   Vascular: Dorsalis Pedis and Posterior Tibial pulses palpable bilateral.  Capillary fill time is immediate to all digits.  Neurological: Epicritic and protective threshold intact bilateral.   Musculoskeletal: Tenderness to palpation to the plantar aspect of the bilateral heels along the plantar fascia.  There is also pain on palpation throughout the forefoot.  All other joints range of motion within normal limits bilateral. Strength 5/5 in all groups bilateral.   Radiographic exam: Patient declined x-rays today  Assessment: 1. plantar fasciitis bilateral feet; chronic 2.  Metatarsalgia bilateral; chronic  Plan of Care:  1. Patient evaluated.  2.  Recommend shoes at [employer] running store 3.  Recommend OTC arch supports 4.  Patient may benefit from custom molded orthotics.  Orthotics department contacted here in our office for prior authorization for insurance 5.  Return to clinic as needed  Felecia Shelling, DPM [facility]  Dr. Felecia Shelling, DPM    [address] [place], [place] [address]                Office [phone]  Fax [phone]

## 2020-09-28 DIAGNOSIS — N390 Urinary tract infection, site not specified: Secondary | ICD-10-CM | POA: Diagnosis not present

## 2020-10-02 DIAGNOSIS — R350 Frequency of micturition: Secondary | ICD-10-CM | POA: Diagnosis not present

## 2020-10-02 DIAGNOSIS — M545 Low back pain, unspecified: Secondary | ICD-10-CM | POA: Diagnosis not present

## 2020-10-05 DIAGNOSIS — R1031 Right lower quadrant pain: Secondary | ICD-10-CM | POA: Diagnosis not present

## 2020-10-05 DIAGNOSIS — R140 Abdominal distension (gaseous): Secondary | ICD-10-CM | POA: Diagnosis not present

## 2020-10-05 DIAGNOSIS — K219 Gastro-esophageal reflux disease without esophagitis: Secondary | ICD-10-CM | POA: Diagnosis not present

## 2020-10-05 DIAGNOSIS — R143 Flatulence: Secondary | ICD-10-CM | POA: Diagnosis not present

## 2021-01-01 DIAGNOSIS — R7309 Other abnormal glucose: Secondary | ICD-10-CM | POA: Diagnosis not present

## 2021-01-01 DIAGNOSIS — E559 Vitamin D deficiency, unspecified: Secondary | ICD-10-CM | POA: Diagnosis not present

## 2021-01-01 DIAGNOSIS — Z0000 Encounter for general adult medical examination without abnormal findings: Secondary | ICD-10-CM | POA: Diagnosis not present

## 2021-01-01 DIAGNOSIS — R7989 Other specified abnormal findings of blood chemistry: Secondary | ICD-10-CM | POA: Diagnosis not present

## 2021-01-08 DIAGNOSIS — Z0000 Encounter for general adult medical examination without abnormal findings: Secondary | ICD-10-CM | POA: Diagnosis not present

## 2021-01-08 DIAGNOSIS — F419 Anxiety disorder, unspecified: Secondary | ICD-10-CM | POA: Diagnosis not present

## 2021-01-08 DIAGNOSIS — G4700 Insomnia, unspecified: Secondary | ICD-10-CM | POA: Diagnosis not present

## 2021-05-08 ENCOUNTER — Ambulatory Visit: Payer: BC Managed Care – PPO | Admitting: Obstetrics and Gynecology

## 2021-05-09 DIAGNOSIS — Z1231 Encounter for screening mammogram for malignant neoplasm of breast: Secondary | ICD-10-CM | POA: Diagnosis not present

## 2021-05-09 DIAGNOSIS — Z01419 Encounter for gynecological examination (general) (routine) without abnormal findings: Secondary | ICD-10-CM | POA: Diagnosis not present

## 2021-05-09 DIAGNOSIS — Z780 Asymptomatic menopausal state: Secondary | ICD-10-CM | POA: Diagnosis not present

## 2021-05-09 DIAGNOSIS — Z6826 Body mass index (BMI) 26.0-26.9, adult: Secondary | ICD-10-CM | POA: Diagnosis not present

## 2021-05-09 DIAGNOSIS — N898 Other specified noninflammatory disorders of vagina: Secondary | ICD-10-CM | POA: Diagnosis not present

## 2021-06-21 ENCOUNTER — Other Ambulatory Visit (HOSPITAL_BASED_OUTPATIENT_CLINIC_OR_DEPARTMENT_OTHER): Payer: Self-pay

## 2021-06-21 ENCOUNTER — Other Ambulatory Visit: Payer: Self-pay

## 2021-06-21 ENCOUNTER — Ambulatory Visit: Payer: Self-pay | Attending: Internal Medicine

## 2021-06-21 DIAGNOSIS — Z23 Encounter for immunization: Secondary | ICD-10-CM

## 2021-06-21 MED ORDER — PFIZER COVID-19 VAC BIVALENT 30 MCG/0.3ML IM SUSP
INTRAMUSCULAR | 0 refills | Status: DC
  Filled 2021-06-21: qty 0.3, 1d supply, fill #0

## 2021-06-21 MED ORDER — FLUARIX QUADRIVALENT 0.5 ML IM SUSY
PREFILLED_SYRINGE | INTRAMUSCULAR | 0 refills | Status: DC
  Filled 2021-06-21: qty 0.5, 1d supply, fill #0

## 2021-06-21 NOTE — Progress Notes (Signed)
   Covid-19 Vaccination Clinic  Name:  Lauren Proctor    MRN: [id number] DOB: 1960-10-14  06/21/2021  [name] was observed post Covid-19 immunization for 15 minutes without incident. She was provided with Vaccine Information Sheet and instruction to access the V-Safe system.   [name] was instructed to call 911 with any severe reactions post vaccine: Difficulty breathing  Swelling of face and throat  A fast heartbeat  A bad rash all over body  Dizziness and weakness

## 2021-07-23 DIAGNOSIS — R059 Cough, unspecified: Secondary | ICD-10-CM | POA: Diagnosis not present

## 2021-07-23 DIAGNOSIS — R6883 Chills (without fever): Secondary | ICD-10-CM | POA: Diagnosis not present

## 2021-07-23 DIAGNOSIS — J029 Acute pharyngitis, unspecified: Secondary | ICD-10-CM | POA: Diagnosis not present

## 2021-07-23 DIAGNOSIS — R519 Headache, unspecified: Secondary | ICD-10-CM | POA: Diagnosis not present

## 2021-07-25 DIAGNOSIS — R6889 Other general symptoms and signs: Secondary | ICD-10-CM | POA: Diagnosis not present

## 2021-07-28 DIAGNOSIS — J018 Other acute sinusitis: Secondary | ICD-10-CM | POA: Diagnosis not present

## 2021-08-29 DIAGNOSIS — H52203 Unspecified astigmatism, bilateral: Secondary | ICD-10-CM | POA: Diagnosis not present

## 2021-08-29 DIAGNOSIS — H43811 Vitreous degeneration, right eye: Secondary | ICD-10-CM | POA: Diagnosis not present

## 2021-08-29 DIAGNOSIS — H5213 Myopia, bilateral: Secondary | ICD-10-CM | POA: Diagnosis not present

## 2021-10-01 DIAGNOSIS — J069 Acute upper respiratory infection, unspecified: Secondary | ICD-10-CM | POA: Diagnosis not present

## 2021-10-24 DIAGNOSIS — J340 Abscess, furuncle and carbuncle of nose: Secondary | ICD-10-CM | POA: Diagnosis not present

## 2022-01-06 DIAGNOSIS — R350 Frequency of micturition: Secondary | ICD-10-CM | POA: Diagnosis not present

## 2022-01-06 DIAGNOSIS — N390 Urinary tract infection, site not specified: Secondary | ICD-10-CM | POA: Diagnosis not present

## 2022-03-20 DIAGNOSIS — F419 Anxiety disorder, unspecified: Secondary | ICD-10-CM | POA: Diagnosis not present

## 2022-03-20 DIAGNOSIS — Z0000 Encounter for general adult medical examination without abnormal findings: Secondary | ICD-10-CM | POA: Diagnosis not present

## 2022-03-20 DIAGNOSIS — E7800 Pure hypercholesterolemia, unspecified: Secondary | ICD-10-CM | POA: Diagnosis not present

## 2022-03-20 DIAGNOSIS — R143 Flatulence: Secondary | ICD-10-CM | POA: Diagnosis not present

## 2022-03-20 DIAGNOSIS — R194 Change in bowel habit: Secondary | ICD-10-CM | POA: Diagnosis not present

## 2022-04-25 DIAGNOSIS — K580 Irritable bowel syndrome with diarrhea: Secondary | ICD-10-CM | POA: Diagnosis not present

## 2022-04-25 DIAGNOSIS — F419 Anxiety disorder, unspecified: Secondary | ICD-10-CM | POA: Diagnosis not present

## 2022-06-05 DIAGNOSIS — J069 Acute upper respiratory infection, unspecified: Secondary | ICD-10-CM | POA: Diagnosis not present

## 2022-06-05 DIAGNOSIS — J340 Abscess, furuncle and carbuncle of nose: Secondary | ICD-10-CM | POA: Diagnosis not present

## 2022-07-21 ENCOUNTER — Other Ambulatory Visit: Payer: Self-pay | Admitting: Physician Assistant

## 2022-07-21 DIAGNOSIS — R10811 Right upper quadrant abdominal tenderness: Secondary | ICD-10-CM | POA: Diagnosis not present

## 2022-07-22 ENCOUNTER — Other Ambulatory Visit: Payer: Self-pay

## 2022-10-17 DIAGNOSIS — M79642 Pain in left hand: Secondary | ICD-10-CM | POA: Diagnosis not present

## 2023-03-23 DIAGNOSIS — E7800 Pure hypercholesterolemia, unspecified: Secondary | ICD-10-CM | POA: Diagnosis not present

## 2023-03-23 DIAGNOSIS — K219 Gastro-esophageal reflux disease without esophagitis: Secondary | ICD-10-CM | POA: Diagnosis not present

## 2023-03-23 DIAGNOSIS — E559 Vitamin D deficiency, unspecified: Secondary | ICD-10-CM | POA: Diagnosis not present

## 2023-03-23 DIAGNOSIS — R42 Dizziness and giddiness: Secondary | ICD-10-CM | POA: Diagnosis not present

## 2023-03-23 DIAGNOSIS — Z0000 Encounter for general adult medical examination without abnormal findings: Secondary | ICD-10-CM | POA: Diagnosis not present

## 2023-06-19 DIAGNOSIS — Z6826 Body mass index (BMI) 26.0-26.9, adult: Secondary | ICD-10-CM | POA: Diagnosis not present

## 2023-06-19 DIAGNOSIS — Z01419 Encounter for gynecological examination (general) (routine) without abnormal findings: Secondary | ICD-10-CM | POA: Diagnosis not present

## 2023-06-19 DIAGNOSIS — Z1231 Encounter for screening mammogram for malignant neoplasm of breast: Secondary | ICD-10-CM | POA: Diagnosis not present

## 2023-06-24 DIAGNOSIS — Z13820 Encounter for screening for osteoporosis: Secondary | ICD-10-CM | POA: Diagnosis not present

## 2023-06-24 LAB — HM DEXA SCAN

## 2023-07-08 NOTE — Progress Notes (Signed)
I, Stevenson Clinch, CMA acting as a scribe for Clementeen Graham, MD.  Lauren Proctor is a 62 y.o. female who presents to [facility] at [facility] today for osteoporosis management. She works as a Artist.   DEXA scan (date, T-score): 06/24/23 Spine= -2.5, L-FN= -2.7, R-FN= -2.7 Prior treatment: none History of Hip, Spine, or Wrist Fx: no Heart disease or stroke: no Cancer: no Kidney Disease: no Gastric/Peptic Ulcer: yes Gastric bypass surgery: no Severe GERD: moderate-severe - Omeprazole prn Hx of seizures: no Age at Menopause: 64 Calcium intake: no supplements, eats dairy Vitamin D intake: yes, 1000 IU Hormone replacement therapy: no Smoking history: never smoked Alcohol: occasional Exercise: moderate - WB exercise, recently retired, plans to start going to the gym 3 x weekly.  Major dental work in past year: no Parents with hip/spine fracture: no Height loss: yes- was 5'5"   Additionally she notes some pain in her right hand especially around the third digit PIP joint.  She has a started spontaneously about a month ago without injury.  She had a similar incident in her left hand.  She notes that she thinks she has psoriasis but has never been formally diagnosed.  She has had a diagnosis of sacroiliitis in the past.   Pertinent review of systems: No fevers or chills  Relevant historical information: Sacroiliitis.  Vitamin D deficiency.   Exam:  BP 104/72   Pulse 63   Ht 5\' 4"  (1.626 m)   Wt 155 lb 12.8 oz (70.7 kg)   LMP 04/21/2016   SpO2 95%   BMI 26.74 kg/m  General: Well Developed, well nourished, and in no acute distress.   MSK: Right hand some swelling around the third digit PIP.  Tender palpation.  Normal motion.  Intact strength.  No MCP synovitis is present.    Lab and Radiology Results  X-ray images right hand obtained today personally and independently interpreted. No acute fractures.  No severe significant  osteoarthritis around the third PIP.  Mild degenerative changes at first [facility].  No erosions are visible. Await formal radiology review    Assessment and Plan: 62 y.o. female with osteoporosis.  T-score is -2.7.  We talked about options.  She is already doing a good job of managing her osteoporosis herself with weightbearing exercises vitamin D and adequate calcium.  Will try using Prolia.  If we can get Prolia approved we will try alendronate.  Will get labs in preparation for Prolia including checking vitamin D.  She has I think an unrelated hand pain condition.  She does have a history of sacroiliitis and pain that was similar in her left hand and perhaps a history of psoriasis.  This is concerning for psoriatic arthritis.  Will check a rheumatologic workup listed below as well.  Will work on hand exercises and try Voltaren gel.  If this does not work well enough she will let me know and we can get hand therapy organized.  Check back in 1 month.  PDMP not reviewed this encounter. Orders Placed This Encounter  Procedures   DG Hand Complete Right    Standing Status:   Future    Number of Occurrences:   1    Standing Expiration Date:   07/13/2024    Order Specific Question:   Reason for Exam (SYMPTOM  OR DIAGNOSIS REQUIRED)    Answer:   eval hand pain    Order Specific Question:   Is patient pregnant?  Answer:   No    Order Specific Question:   Preferred imaging location?    Answer:   [facility]   Comprehensive metabolic panel    Standing Status:   Future    Number of Occurrences:   1    Standing Expiration Date:   07/13/2024   Sedimentation rate    Standing Status:   Future    Number of Occurrences:   1    Standing Expiration Date:   07/13/2024   Rheumatoid factor    Standing Status:   Future    Number of Occurrences:   1    Standing Expiration Date:   07/13/2024   Cyclic citrul peptide antibody, IgG    Standing Status:   Future    Number of Occurrences:   1     Standing Expiration Date:   07/13/2024   ANA    Standing Status:   Future    Number of Occurrences:   1    Standing Expiration Date:   07/13/2024   CK    Standing Status:   Future    Number of Occurrences:   1    Standing Expiration Date:   07/13/2024   HLA-B27 antigen    Standing Status:   Future    Number of Occurrences:   1    Standing Expiration Date:   07/13/2024   VITAMIN D 25 Hydroxy (Vit-D Deficiency, Fractures)    Standing Status:   Future    Number of Occurrences:   1    Standing Expiration Date:   07/13/2024   Magnesium    Standing Status:   Future    Number of Occurrences:   1    Standing Expiration Date:   07/13/2024   Phosphorus    Standing Status:   Future    Number of Occurrences:   1    Standing Expiration Date:   07/13/2024   No orders of the defined types were placed in this encounter.    Discussed warning signs or symptoms. Please see discharge instructions. Patient expresses understanding.   The above documentation has been reviewed and is accurate and complete Clementeen Graham, M.D.

## 2023-07-14 ENCOUNTER — Encounter: Payer: Self-pay | Admitting: Family Medicine

## 2023-07-14 ENCOUNTER — Ambulatory Visit (INDEPENDENT_AMBULATORY_CARE_PROVIDER_SITE_OTHER): Payer: BC Managed Care – PPO

## 2023-07-14 ENCOUNTER — Ambulatory Visit: Payer: BC Managed Care – PPO | Admitting: Family Medicine

## 2023-07-14 VITALS — BP 104/72 | HR 63 | Ht 64.0 in | Wt 155.8 lb

## 2023-07-14 DIAGNOSIS — M810 Age-related osteoporosis without current pathological fracture: Secondary | ICD-10-CM

## 2023-07-14 DIAGNOSIS — E559 Vitamin D deficiency, unspecified: Secondary | ICD-10-CM | POA: Diagnosis not present

## 2023-07-14 DIAGNOSIS — M79641 Pain in right hand: Secondary | ICD-10-CM

## 2023-07-14 DIAGNOSIS — M79644 Pain in right finger(s): Secondary | ICD-10-CM | POA: Diagnosis not present

## 2023-07-14 LAB — COMPREHENSIVE METABOLIC PANEL
ALT: 16 U/L (ref 0–35)
AST: 20 U/L (ref 0–37)
Albumin: 4.3 g/dL (ref 3.5–5.2)
Alkaline Phosphatase: 89 U/L (ref 39–117)
BUN: 10 mg/dL (ref 6–23)
CO2: 28 meq/L (ref 19–32)
Calcium: 9.4 mg/dL (ref 8.4–10.5)
Chloride: 100 meq/L (ref 96–112)
Creatinine, Ser: 0.71 mg/dL (ref 0.40–1.20)
GFR: 92.1 mL/min (ref >60.00)
Glucose, Bld: 84 mg/dL (ref 70–99)
Potassium: 4 meq/L (ref 3.5–5.1)
Sodium: 135 meq/L (ref 135–145)
Total Bilirubin: 0.4 mg/dL (ref 0.2–1.2)
Total Protein: 7.1 g/dL (ref 6.0–8.3)

## 2023-07-14 LAB — SEDIMENTATION RATE: Sed Rate: 14 mm/h (ref 0–30)

## 2023-07-14 LAB — CK: Total CK: 87 U/L (ref 7–177)

## 2023-07-14 LAB — PHOSPHORUS: Phosphorus: 3.3 mg/dL (ref 2.3–4.6)

## 2023-07-14 LAB — MAGNESIUM: Magnesium: 2 mg/dL (ref 1.5–2.5)

## 2023-07-14 NOTE — Patient Instructions (Addendum)
Thank you for coming in today.   Please get labs today before you leave   Please get an Xray today before you leave .  We will work to authorize Prolia  Check back in 1 month

## 2023-07-15 ENCOUNTER — Telehealth: Payer: Self-pay

## 2023-07-15 NOTE — Telephone Encounter (Signed)
-----   Message from Adron Bene sent at 07/14/2023 11:22 AM EDT ----- Regarding: Prolia Please work to [employer]

## 2023-07-16 NOTE — Telephone Encounter (Signed)
Prolia VOB initiated via [email or web address]  Next Prolia inj DUE: new start    Assessment and Plan: 62 y.o. female with osteoporosis.  T-score is -2.7.  We talked about options.  She is already doing a good job of managing her osteoporosis herself with weightbearing exercises vitamin D and adequate calcium.  Will try using Prolia.  If we can get Prolia approved we will try alendronate.  Will get labs in preparation for Prolia including checking vitamin D.   She has I think an unrelated hand pain condition.  She does have a history of sacroiliitis and pain that was similar in her left hand and perhaps a history of psoriasis.  This is concerning for psoriatic arthritis.  Will check a rheumatologic workup listed below as well.  Will work on hand exercises and try Voltaren gel.  If this does not work well enough she will let me know and we can get hand therapy organized.  Check back in 1 month.   DEXA scan (date, T-score): 06/24/23 Spine= -2.5, L-FN= -2.7, R-FN= -2.7 Prior treatment: none History of Hip, Spine, or Wrist Fx: no Heart disease or stroke: no Cancer: no Kidney Disease: no Gastric/Peptic Ulcer: yes Gastric bypass surgery: no Severe GERD: moderate-severe - Omeprazole prn Hx of seizures: no Age at Menopause: 26 Calcium intake: no supplements, eats dairy Vitamin D intake: yes, 1000 IU Hormone replacement therapy: no Smoking history: never smoked Alcohol: occasional Exercise: moderate - WB exercise, recently retired, plans to start going to the gym 3 x weekly.  Major dental work in past year: no Parents with hip/spine fracture: no Height loss: yes- was 5'5"

## 2023-07-17 LAB — ANTI-NUCLEAR AB-TITER (ANA TITER): ANA Titer 1: 1:80 {titer} — ABNORMAL HIGH

## 2023-07-17 LAB — VITAMIN D 25 HYDROXY (VIT D DEFICIENCY, FRACTURES): Vit D, 25-Hydroxy: 35 ng/mL (ref 30–100)

## 2023-07-17 LAB — CYCLIC CITRUL PEPTIDE ANTIBODY, IGG: Cyclic Citrullin Peptide Ab: <16 U

## 2023-07-17 LAB — HLA-B27 ANTIGEN: HLA-B27 Antigen: NEGATIVE

## 2023-07-17 LAB — RHEUMATOID FACTOR: Rheumatoid fact SerPl-aCnc: <10 [IU]/mL (ref <14)

## 2023-07-17 LAB — ANA: Anti Nuclear Antibody (ANA): POSITIVE — AB

## 2023-07-20 NOTE — Telephone Encounter (Signed)
Needs COPAY enrollment   Prior Auth REQUIRED for PROLIA  PA PROCESS DETAILS: Prior Authorization is Required. We are unable to confirm if a PA is on file. Please check your records or contact the payer.

## 2023-07-21 NOTE — Progress Notes (Signed)
One of the rheumatology labs is mildly positive.  The ANA titer is a bit elevated but other labs are normal. Vitamin D calcium phosphorus are normal indicating okay to proceed for Prolia  Will talk more about your hand and what we can do about the labs when you come back later this month.

## 2023-07-21 NOTE — Telephone Encounter (Signed)
Prior Authorization initiated for PROLIA via [email or web address] KEY: [id number]

## 2023-07-21 NOTE — Telephone Encounter (Signed)
Fax received from [employer]  NOT COVERED UNDER PHARMACY BENEFITS

## 2023-07-21 NOTE — Telephone Encounter (Signed)
Pt has been enrolled in [employer]

## 2023-07-22 NOTE — Telephone Encounter (Signed)
Prior auth initiated via FAX form.

## 2023-07-28 NOTE — Telephone Encounter (Signed)
Have not received determination, prior auth form re-faxed.

## 2023-07-29 NOTE — Progress Notes (Signed)
Right hand x-ray looks normal to radiology

## 2023-08-11 ENCOUNTER — Other Ambulatory Visit: Payer: Self-pay

## 2023-08-11 ENCOUNTER — Ambulatory Visit: Payer: BC Managed Care – PPO | Admitting: Family Medicine

## 2023-08-11 VITALS — BP 136/84 | HR 61 | Ht 64.0 in | Wt 156.0 lb

## 2023-08-11 DIAGNOSIS — M810 Age-related osteoporosis without current pathological fracture: Secondary | ICD-10-CM | POA: Insufficient documentation

## 2023-08-11 DIAGNOSIS — M79641 Pain in right hand: Secondary | ICD-10-CM | POA: Diagnosis not present

## 2023-08-11 NOTE — Telephone Encounter (Signed)
Prior Auth for [facility] APPROVED PA# [id number] Valid:07/23/23-07/22/24 120 units (2 injections)  BUY AND BILL

## 2023-08-11 NOTE — Telephone Encounter (Signed)
Pt ready for scheduling on or after 08/11/23  Out-of-pocket cost due at time of visit: $346 [employer] Copay Assistance (enrolled): $25   Primary: BCBS PA PPO Prolia co-insurance: 20% (approximately $320.99) Admin fee co-insurance: 20% (approximately $25)  Deductible: $118.86 of $1,500 met - must be met for coverage to apply  Prior Auth APPROVED PA# [id number] Valid:07/23/23-07/22/24 120 units (2 injections)   BUY AND BILL  Secondary:  Prolia co-insurance:  Admin fee co-insurance:  Deductible:  Prior Auth:  PA# Valid:   ** This summary of benefits is an estimation of the patient's out-of-pocket cost. Exact cost may vary based on individual plan coverage.

## 2023-08-11 NOTE — Progress Notes (Signed)
Rubin Payor, PhD, LAT, ATC acting as a scribe for Clementeen Graham, MD.  Lauren Proctor is a 62 y.o. female who presents to [facility] at [facility] today for f/u R hand pain. Pt was last seen by Dr. Denyse Amass on 07/14/23 and for her osteoporosis and lab were obtained and she was advised to work on hand exercises and use Voltaren gel.  Today, pt reports ongoing for about 2 months. Pt locates pain to R 3rd finger. Swelling present around the IP joint. She is RHD.  Grip strength: painful Aggravates: gripping motions,  Treatments tried: IBU, Voltaren gel, ice  Dx testing: 07/14/23 R hand XR & labs  Pertinent review of systems: No fevers or chills  Relevant historical information: Thyroid nodule   Exam:  BP 136/84   Pulse 61   Ht 5\' 4"  (1.626 m)   Wt 156 lb (70.8 kg)   LMP 04/21/2016   SpO2 98%   BMI 26.78 kg/m  General: Well Developed, well nourished, and in no acute distress.   MSK: Right hand minimal swelling.  Generally normal range of motion.    Lab and Radiology Results DG Hand Complete Right  Result Date: 07/28/2023 CLINICAL DATA:  Right middle finger pain, chronic.  No known injury. EXAM: RIGHT HAND - COMPLETE 3+ VIEW COMPARISON:  None Available. FINDINGS: There is no evidence of fracture or dislocation. There is no evidence of arthropathy or other focal bone abnormality. Soft tissues are unremarkable. IMPRESSION: Negative. Electronically Signed   By: Charlett Nose M.D.   On: 07/28/2023 14:39   I, Clementeen Graham, personally (independently) visualized and performed the interpretation of the images attached in this note.  Recent Results (from the past 2160 hour(s))  HM DEXA SCAN     Status: None   Collection Time: 06/24/23  2:04 PM  Result Value Ref Range   HM Dexa Scan Osteoporosis     Comment: Abstracted by HIM  Phosphorus     Status: None   Collection Time: 07/14/23 11:37 AM  Result Value Ref Range   Phosphorus 3.3 2.3 - 4.6 mg/dL  Magnesium      Status: None   Collection Time: 07/14/23 11:37 AM  Result Value Ref Range   Magnesium 2.0 1.5 - 2.5 mg/dL  VITAMIN D 25 Hydroxy (Vit-D Deficiency, Fractures)     Status: None   Collection Time: 07/14/23 11:37 AM  Result Value Ref Range   Vit D, 25-Hydroxy 35 30 - 100 ng/mL    Comment: Vitamin D Status         25-OH Vitamin D: . Deficiency:                    <20 ng/mL Insufficiency:             20 - 29 ng/mL Optimal:                 > or = 30 ng/mL . For 25-OH Vitamin D testing on patients on  D2-supplementation and patients for whom quantitation  of D2 and D3 fractions is required, the QuestAssureD(TM) 25-OH VIT D, (D2,D3), LC/MS/MS is recommended: order  code 28413 (patients >19yrs). . See Note 1 . Note 1 . For additional information, please refer to  http://education.[facility].com/faq/FAQ199  (This link is being provided for informational/ educational purposes only.)   HLA-B27 antigen     Status: None   Collection Time: 07/14/23 11:37 AM  Result Value Ref Range   HLA-B27 Antigen  NEGATIVE NEGATIVE  CK     Status: None   Collection Time: 07/14/23 11:37 AM  Result Value Ref Range   Total CK 87 7 - 177 U/L  ANA     Status: Abnormal   Collection Time: 07/14/23 11:37 AM  Result Value Ref Range   Anti Nuclear Antibody (ANA) POSITIVE (A) NEGATIVE    Comment: ANA IFA is a first line screen for detecting the presence of up to approximately 150 autoantibodies in various autoimmune diseases. A positive ANA IFA result is suggestive of autoimmune disease and reflexes to titer and pattern. Further laboratory testing may be considered if clinically indicated. . For additional information, please refer to http://education.[facility].com/faq/FAQ177 (This link is being provided for informational/ educational purposes only.) .   Cyclic citrul peptide antibody, IgG     Status: None   Collection Time: 07/14/23 11:37 AM  Result Value Ref Range   Cyclic Citrullin  Peptide Ab <16 UNITS    Comment: Reference Range Negative:            <20 Weak Positive:       20-39 Moderate Positive:   40-59 Strong Positive:     >59 .   Rheumatoid factor     Status: None   Collection Time: 07/14/23 11:37 AM  Result Value Ref Range   Rheumatoid fact SerPl-aCnc <10 <14 IU/mL  Sedimentation rate     Status: None   Collection Time: 07/14/23 11:37 AM  Result Value Ref Range   Sed Rate 14 0 - 30 mm/hr  Comprehensive metabolic panel     Status: None   Collection Time: 07/14/23 11:37 AM  Result Value Ref Range   Sodium 135 135 - 145 mEq/L   Potassium 4.0 3.5 - 5.1 mEq/L   Chloride 100 96 - 112 mEq/L   CO2 28 19 - 32 mEq/L   Glucose, Bld 84 70 - 99 mg/dL   BUN 10 6 - 23 mg/dL   Creatinine, Ser 4.09 0.40 - 1.20 mg/dL   Total Bilirubin 0.4 0.2 - 1.2 mg/dL   Alkaline Phosphatase 89 39 - 117 U/L   AST 20 0 - 37 U/L   ALT 16 0 - 35 U/L   Total Protein 7.1 6.0 - 8.3 g/dL   Albumin 4.3 3.5 - 5.2 g/dL   GFR 81.19 >14.78 mL/min    Comment: Calculated using the CKD-EPI Creatinine Equation (2021)   Calcium 9.4 8.4 - 10.5 mg/dL  Anti-nuclear ab-titer (ANA titer)     Status: Abnormal   Collection Time: 07/14/23 11:37 AM  Result Value Ref Range   ANA Titer 1 1:80 (H) titer    Comment: A low level ANA titer may be present in pre-clinical autoimmune diseases and normal individuals.                 Reference Range                 <1:40        Negative                 1:40-1:80    Low Antibody Level                 >1:80        Elevated Antibody Level .    ANA Pattern 1 Nuclear, Dense Fine Speckled (A)     Comment: Dense fine speckled pattern is seen in normal individuals and rarely associated with systemic lupus erythematosis (SLE), Sjogren's syndrome and  systemic sclerosis. . AC-2: Dense Fine Speckled . International Consensus on ANA Patterns ([email or web address])         Assessment and Plan: 62 y.o. female with right hand pain and  swelling.  ANA was positive but other labs were negative.  I do not think it is likely she has a rheumatologic disease.  Plan for referral to Occupational Therapy.  If not improving within a month of OT trial consider rheumatology referral.  Recommend starting Prolia as discussed for osteoporosis.   PDMP not reviewed this encounter. Orders Placed This Encounter  Procedures   Ambulatory referral to Occupational Therapy    Referral Priority:   Routine    Referral Type:   Occupational Therapy    Referral Reason:   Specialty Services Required    Requested Specialty:   Occupational Therapy    Number of Visits Requested:   1   No orders of the defined types were placed in this encounter.    Discussed warning signs or symptoms. Please see discharge instructions. Patient expresses understanding.   The above documentation has been reviewed and is accurate and complete Clementeen Graham, M.D. Total encounter time 30 minutes including face-to-face time with the patient and, reviewing past medical record, and charting on the date of service.

## 2023-08-11 NOTE — Patient Instructions (Addendum)
Thank you for coming in today.   Schedule a "nurse visit" for next week for your Prolia injection  I've referred you to Hand Therapy.  Let us know if you don't hear from them in one week.   Let me know if not better and I can refer you to rheumatology.

## 2023-08-11 NOTE — Telephone Encounter (Signed)
Will contact patient to schedule when supply comes in.

## 2023-08-12 ENCOUNTER — Telehealth: Payer: Self-pay

## 2023-08-12 NOTE — Telephone Encounter (Signed)
Patient called stating that she wanted to think about the prolia medication a little more before we order anything.

## 2023-08-12 NOTE — Telephone Encounter (Signed)
Lauren Proctor, CMA2 hours ago (8:22 AM)    Patient called stating that she wanted to think about the prolia medication a little more before we order anything.

## 2023-08-19 NOTE — Therapy (Signed)
OUTPATIENT OCCUPATIONAL THERAPY ORTHO EVALUATION  Patient Name: Lauren Proctor MRN: [id number] DOB:1961/06/05, 62 y.o., female Today's Date: 08/20/2023  PCP: Artist Pais, FNP REFERRING PROVIDER: Rodolph Bong, MD   END OF SESSION:  OT End of Session - 08/20/23 1015     Visit Number 1    Number of Visits 5    Date for OT Re-Evaluation 09/25/23    Authorization Type BCBS    OT Start Time 1015    OT Stop Time 1100    OT Time Calculation (min) 45 min    Equipment Utilized During Treatment Compressive gauze    Activity Tolerance Patient tolerated treatment well;No increased pain;Patient limited by fatigue;Patient limited by pain    Behavior During Therapy [facility] for tasks assessed/performed             Past Medical History:  Diagnosis Date   Anxiety    Over pelvic exams   Arthritis 2006   Right Hip   UTI (lower urinary tract infection)    Past Surgical History:  Procedure Laterality Date   BIOPSY THYROID Left 03/2014   CESAREAN SECTION  1992   TONSILLECTOMY  1988   Patient Active Problem List   Diagnosis Date Noted   Age-related osteoporosis without current pathological fracture 08/11/2023   Sacroiliitis, not elsewhere classified (HCC) 09/05/2020   Low back pain 09/05/2020   History of colonic polyps 09/05/2020   Sleep disturbance 09/05/2020   Vitamin D deficiency 09/05/2020   Chondromalacia of right patella 04/12/2019   Pain in left knee 09/21/2017   Multiple thyroid nodules, left lobe 04/05/2014   Anxiety state 02/25/2013    ONSET DATE: ~ 2 months   REFERRING DIAG: M79.641 (ICD-10-CM) - Hand pain, right   THERAPY DIAG:  Pain in joint of right hand - Plan: Ot plan of care cert/re-cert  Other lack of coordination - Plan: Ot plan of care cert/re-cert  Stiffness of right wrist, not elsewhere classified - Plan: Ot plan of care cert/re-cert  Rationale for Evaluation and Treatment: Rehabilitation  SUBJECTIVE:   SUBJECTIVE STATEMENT: She states having  pain and swelling in Rt MF PIP J for about 9 weeks now, unable to wear her typical ring, having pain with daily activities, jars, etc. Interested in self-care management strategies.  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    PERTINENT HISTORY: per referral: Nate for hand therapy; R hand pain (3rd finger)" she has some osteoporosis and ANA testing was positive's showing possible autoimmune disease.  PRECAUTIONS: None; RED FLAGS: None; WEIGHT BEARING RESTRICTIONS: No  PAIN:  Are you having pain? Yes: NPRS scale: 0/10 at rest now, in past week at worst up to 6-7/10 Pain location: Rt hand MF PIPJ Pain description: sharp, sometimes throbbing Aggravating factors: lifting squeezing  Relieving factors: heat  FALLS: Has patient fallen in last 6 months? No  PLOF: Independent  PATIENT GOALS: Decrease pain, stiffness in Rt dom hand  Interested in self-care management strategies. Marland Kitchen   NEXT MD VISIT: As needed   OBJECTIVE: (All objective assessments below are from initial evaluation on: 08/20/2023 unless otherwise specified.)   HAND DOMINANCE: Right   ADLs: Overall ADLs: States decreased ability to grab, hold household objects, pain and difficulty to open containers, perform FMS tasks (manipulate fasteners on clothing), mild to moderate bathing problems as well.    FUNCTIONAL OUTCOME MEASURES: Eval: Quick DASH 25% impairment today  (Higher % Score  =  More Impairment)    UPPER EXTREMITY ROM  Shoulder to Wrist AROM Right eval Left eval  Wrist flexion 65 80  Wrist extension 70 78  (Blank rows = not tested)   Hand AROM Right eval  Full Fist Ability (or Gap to Distal Palmar Crease) Full, MF stands out   Thumb Opposition  (Kapandji Scale)  10/10  Long MCP (0-90) 0- 84   Long PIP (0-100) 0- 99   Long DIP (0-70) 0- 78  (Blank rows = not tested)   HAND FUNCTION: Eval:  No observed weakness in affected Rt hand.  Grip strength Right: 36 lbs, Left: 36 lbs   COORDINATION: Eval:  No observed coordination impairments with affected bil hand.  SENSATION: Eval:  Light touch intact today  EDEMA:   Eval:  Mildly swollen in Rt hand MF today, 6.6cm circumferentially around Rt MF PIP J, compared to 5.9cm Lt hand  COGNITION: Eval: Overall cognitive status: WFL for evaluation today   OBSERVATIONS:   Eval: She has apparent arthritis in the right hand middle finger PIP joint due to fusiform swelling, tenderness to palpation at the joint, and typical arthritis complaints (worse with ice or cold weather, etc.).  Today has not "flareup day" and so her grip strength was not significantly limited and her motion is actually fairly good, but she had some tightness in her wrist.  Test for intrinsic tightness is difficult to determine between capsular and joint tightness-she seems to have a bit of both to the middle finger.  TODAY'S TREATMENT:  Post-evaluation treatment:   She was given initial self-care/safety recommendations for the management of arthritis including the following list:  "How you will be successful:  Change habits/routines, prevent degeneration and pain Exercise program to "open" joint spaces, relieve pressure and tension Protect with supportive bracing   Avoid:  Repetitive, heavy activities or prolonged postures  Things that hurt or cause your hands to swell Direct pressure on sore joints  Specifics:  Do a warm soak every morning for 3-5 minutes (or a heating pad/hot shower, etc.). This loosens up stiff hands/wrists.  Start every day with a non-painful stretch routine assigned by your therapist.  Stretch again, once or twice that day, thinking about what you have planned.   It's smart to gently warmup hands and stretch before typically stressful activities (or before bed if your hands hurt in the night).  If you must do repetitive or stressful activities, use some type of support (arthritis gloves, Kinesio tape, braces, etc.).  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  Take breaks for rest and recovery; don't carry too many bags at once or "force" yourself to finish a job.   Keep hands covered and warm in the winter or cool, rainy weather."  Next, she was given the following home exercise program to gently stretch and nonpainful he work on her wrist stiffness as well as her finger pain and swelling.  Each of these were explained to her, demonstrated to her and she performs back feeling no additional pain and actually feeling better and looser.  She was also supplied with a compressive gauze wrap to wear around the middle finger at night to help limit swelling.  She states understanding all of these things and leaves feeling much better than when she came in  Exercises - Wrist Flexion Stretch  - 4 x daily - 3-5 reps - 15 sec hold - Wrist Prayer Stretch  - 4 x daily - 3-5 reps - 15 sec  hold - BACK KNUCKLE STRETCHES   - 4 x daily - 3-5 reps - 15 sec hold - HOOK Stretch  - 4 x daily - 3-5 reps - 15-20 sec hold - Towel Roll Grip with Forearm in Neutral  - 3 x daily - 5 reps - 10 sec hold  PATIENT EDUCATION: Education details: See tx section above for details  Person educated: Patient Education method: Verbal Instruction, Teach back, Handouts  Education comprehension: States and demonstrates understanding, Additional Education required    HOME EXERCISE PROGRAM: Access Code: [email or web address] URL: [email or web address] Date: 08/20/2023 Prepared by: Fannie Knee   GOALS: Goals reviewed with patient? Yes   SHORT TERM GOALS: (STG required if POC>30 days) Target Date: 08/28/2023  Pt will obtain protective, custom orthotic. Goal status: TBD/PRN  2.  Pt will demo/state understanding of initial HEP to improve pain levels and prerequisite motion. Goal status: INITIAL   LONG TERM GOALS: Target Date: 09/25/2023  Pt will improve functional ability by decreased impairment per  Quick DASH assessment from 25% to 10% or better, for better quality of life. Goal status: INITIAL  2.  Pt will improve A/ROM in right wrist flexion and extension from 65/70 to at least 72 degrees each, to have functional motion for tasks like reach and grasp.  Goal status: INITIAL  3.  Pt will decrease pain at worst from 6-7/10 to 3/10 or better to have better sleep and occupational participation in daily roles. Goal status: INITIAL  ASSESSMENT:  CLINICAL IMPRESSION: Patient is a 62y.o. female who was seen today for occupational therapy evaluation for right hand pain mainly in the middle finger PIP joint and also some intermittent complaints of the thumb CMC joint pain that is thought to be arthritis.  This makes her feel stiff, swollen, painful and decreased functional ability.  She will benefit from outpatient occupational therapy to manage symptoms and increase quality of life.  She felt better today after her initial treatment, and she may only need a few sessions of follow-up to understand this plan.   PERFORMANCE DEFICITS: in functional skills including ADLs, IADLs, coordination, dexterity, edema, strength, pain, fascial restrictions, flexibility, Fine motor control, body mechanics, decreased knowledge of precautions, and UE functional use, cognitive skills including problem solving and safety awareness, and psychosocial skills including coping strategies, environmental adaptation, and habits.   IMPAIRMENTS: are limiting patient from ADLs, IADLs, work, leisure, and social participation.   COMORBIDITIES: may have co-morbidities  that affects occupational performance. Patient will benefit from skilled OT to address above impairments and improve overall function.  MODIFICATION OR ASSISTANCE TO COMPLETE EVALUATION: No modification of tasks or assist necessary to complete an evaluation.  OT OCCUPATIONAL PROFILE AND HISTORY: Problem focused assessment: Including review of records relating to  presenting problem.  CLINICAL DECISION MAKING: LOW - limited treatment options, no task modification necessary  REHAB POTENTIAL: Excellent  EVALUATION COMPLEXITY: Low      PLAN:  OT FREQUENCY: 1x/week  OT DURATION: 6 weeks through 09/25/2023 as needed and up to 5 total visits  PLANNED INTERVENTIONS: 97168 OT Re-evaluation, 97535 self care/ADL training, 54098 therapeutic exercise, 97530 therapeutic activity, 97112 neuromuscular re-education, 97140 manual therapy, 97039 fluidotherapy, 97010 moist heat, 97010 cryotherapy, 97034 contrast bath, 97760 Orthotics management and training, 11914 Splinting (initial encounter), [id number] Subsequent splinting/medication, compression bandaging, Dry needling, energy conservation, coping strategies training, and patient/family education  RECOMMENDED OTHER SERVICES: None now  CONSULTED AND AGREED WITH PLAN OF CARE: Patient  PLAN FOR NEXT SESSION:   Review initial home  exercise program and recommendations and discharge when all goals are met   Fannie Knee, OTR/L, CHT 08/20/2023, 12:09 PM

## 2023-08-20 ENCOUNTER — Other Ambulatory Visit: Payer: Self-pay

## 2023-08-20 ENCOUNTER — Ambulatory Visit (INDEPENDENT_AMBULATORY_CARE_PROVIDER_SITE_OTHER): Payer: BC Managed Care – PPO | Admitting: Rehabilitative and Restorative Service Providers"

## 2023-08-20 ENCOUNTER — Encounter: Payer: Self-pay | Admitting: Rehabilitative and Restorative Service Providers"

## 2023-08-20 DIAGNOSIS — M25541 Pain in joints of right hand: Secondary | ICD-10-CM

## 2023-08-20 DIAGNOSIS — R278 Other lack of coordination: Secondary | ICD-10-CM

## 2023-08-20 DIAGNOSIS — M25631 Stiffness of right wrist, not elsewhere classified: Secondary | ICD-10-CM

## 2023-08-26 ENCOUNTER — Encounter: Payer: BC Managed Care – PPO | Admitting: Rehabilitative and Restorative Service Providers"

## 2023-09-07 ENCOUNTER — Encounter: Payer: Self-pay | Admitting: Occupational Therapy

## 2023-09-07 ENCOUNTER — Ambulatory Visit: Payer: BC Managed Care – PPO | Admitting: Occupational Therapy

## 2023-09-07 DIAGNOSIS — R278 Other lack of coordination: Secondary | ICD-10-CM

## 2023-09-07 DIAGNOSIS — M25541 Pain in joints of right hand: Secondary | ICD-10-CM

## 2023-09-07 DIAGNOSIS — M25631 Stiffness of right wrist, not elsewhere classified: Secondary | ICD-10-CM | POA: Diagnosis not present

## 2023-09-07 NOTE — Therapy (Addendum)
 OUTPATIENT OCCUPATIONAL THERAPY ORTHO TREATMENT & DISCHARGE NOTE Patient Name: Lauren Proctor MRN: [id number] DOB:[birth year], 62 y.o., female Today's Date: 09/07/2023  PCP: Maxcine Spalding, FNP REFERRING PROVIDER: Syliva Even, MD                OT DISCHARGE SUMMARY Unfortunately, the patient only showed up in therapy for 2 sessions and then stopped attending.  She did not come back in or call and for making appointments. Goals could not be addressed, she will be officially discharged from therapy at this point.  Leartis Proud, OTR/L, CHT  01/12/2024                  END OF SESSION:  OT End of Session - 09/07/23 1106     Visit Number 2    Number of Visits 5    Date for OT Re-Evaluation 09/25/23    Authorization Type BCBS    OT Start Time 1102    OT Stop Time 1145    OT Time Calculation (min) 43 min    Equipment Utilized During Treatment Compressive gauze    Activity Tolerance Patient tolerated treatment well;No increased pain;Patient limited by fatigue;Patient limited by pain    Behavior During Therapy [facility] for tasks assessed/performed             Past Medical History:  Diagnosis Date   Anxiety    Over pelvic exams   Arthritis 2006   Right Hip   UTI (lower urinary tract infection)    Past Surgical History:  Procedure Laterality Date   BIOPSY THYROID  Left 03/2014   CESAREAN SECTION  1992   TONSILLECTOMY  1988   Patient Active Problem List   Diagnosis Date Noted   Age-related osteoporosis without current pathological fracture 08/11/2023   Sacroiliitis, not elsewhere classified (HCC) 09/05/2020   Low back pain 09/05/2020   History of colonic polyps 09/05/2020   Sleep disturbance 09/05/2020   Vitamin D  deficiency 09/05/2020   Chondromalacia of right patella 04/12/2019   Pain in left knee 09/21/2017   Multiple thyroid  nodules, left lobe 04/05/2014   Anxiety state 02/25/2013    ONSET DATE: ~ 2 months   REFERRING DIAG: M79.641  (ICD-10-CM) - Hand pain, right   THERAPY DIAG:  Pain in joint of right hand  Stiffness of right wrist, not elsewhere classified  Other lack of coordination  Rationale for Evaluation and Treatment: Rehabilitation  SUBJECTIVE:   SUBJECTIVE STATEMENT: Pt reports pain entire long finger now and some swelling at DIP joint as well.    PERTINENT HISTORY: per referral: Nate for hand therapy; R hand pain (3rd finger)" she has some osteoporosis and ANA testing was positive's showing possible autoimmune disease.  PRECAUTIONS: None; RED FLAGS: None;   WEIGHT BEARING RESTRICTIONS: No  PAIN:  Are you having pain? Yes: NPRS scale: 5/10 at rest now, in past week at worst up to 8/10 Pain location: Rt hand MF PIPJ Pain description: sharp, sometimes throbbing Aggravating factors: lifting squeezing  Relieving factors: heat  FALLS: Has patient fallen in last 6 months? No  PLOF: Independent  PATIENT GOALS: Decrease pain, stiffness in Rt dom hand  Interested in self-care management strategies. Aaron Aas   NEXT MD VISIT: As needed   OBJECTIVE: (All objective assessments below are from initial evaluation on: 08/20/2023 unless otherwise specified.)   HAND DOMINANCE: Right   ADLs: Overall ADLs: States decreased ability to grab, hold household objects, pain and difficulty to open containers, perform FMS tasks (manipulate fasteners on  clothing), mild to moderate bathing problems as well.    FUNCTIONAL OUTCOME MEASURES: Eval: Quick DASH 25% impairment today  (Higher % Score  =  More Impairment)    UPPER EXTREMITY ROM     Shoulder to Wrist AROM Right eval Left eval  Wrist flexion 65 80  Wrist extension 70 78  (Blank rows = not tested)   Hand AROM Right eval  Full Fist Ability (or Gap to Distal Palmar Crease) Full, MF stands out   Thumb Opposition  (Kapandji Scale)  10/10  Long MCP (0-90) 0- 84   Long PIP (0-100) 0- 99   Long DIP (0-70) 0- 78  (Blank rows = not tested)   HAND  FUNCTION: Eval:  No observed weakness in affected Rt hand.  Grip strength Right: 36 lbs, Left: 36 lbs   COORDINATION: Eval: No observed coordination impairments with affected bil hand.  SENSATION: Eval:  Light touch intact today  EDEMA:   Eval:  Mildly swollen in Rt hand MF today, 6.6cm circumferentially around Rt MF PIP J, compared to 5.9cm Lt hand  COGNITION: Eval: Overall cognitive status: WFL for evaluation today   OBSERVATIONS:   Eval: She has apparent arthritis in the right hand middle finger PIP joint due to fusiform swelling, tenderness to palpation at the joint, and typical arthritis complaints (worse with ice or cold weather, etc.).  Today has not "flareup day" and so her grip strength was not significantly limited and her motion is actually fairly good, but she had some tightness in her wrist.  Test for intrinsic tightness is difficult to determine between capsular and joint tightness-she seems to have a bit of both to the middle finger.  TODAY'S TREATMENT:  Reviewed HEP and recommendations from previous session.   Also discussed joint protection strategies, task modifications, A/E recommendations, and splinting options. Pt provided handouts on jar openers, vegetable chopper, CMC braces. Discussed/recommended electric can opener and task modifications/different ways to hold things (pots/pans, groceries, etc). Pt also issued red foam to build up writing utensil for pain management and comfort w/ writing.   Pt issued gel finger sleeve for Rt middle finger  Encouraged pt to discuss with Dr. Alease Hunter further medical management and possible referral to rheumatologist  PATIENT EDUCATION: Education details: See tx section above for details  Person educated: Patient Education method: Verbal Instruction, Teach back, Handouts  Education comprehension: States and demonstrates understanding, Additional Education required    HOME EXERCISE PROGRAM: Access Code: [email or web address] URL:  [email or web address] Date: 08/20/2023 Prepared by: Leartis Proud   GOALS: Goals reviewed with patient? Yes   SHORT TERM GOALS: (STG required if POC>30 days) Target Date: 08/28/2023  Pt will obtain protective, custom orthotic. Goal status: IN PROGRESS (recommended pre-fab CMC arthritis brace online)   2.  Pt will demo/state understanding of initial HEP to improve pain levels and prerequisite motion. Goal status: IN PROGRESS   LONG TERM GOALS: Target Date: 09/25/2023  Pt will improve functional ability by decreased impairment per Quick DASH assessment from 25% to 10% or better, for better quality of life. Goal status: INITIAL  2.  Pt will improve A/ROM in right wrist flexion and extension from 65/70 to at least 72 degrees each, to have functional motion for tasks like reach and grasp.  Goal status: INITIAL  3.  Pt will decrease pain at worst from 6-7/10 to 3/10 or better to have better sleep and occupational participation in daily roles. Goal status: INITIAL  ASSESSMENT:  CLINICAL IMPRESSION: Patient returns for treatment  today with increased pain in Rt middle finger as well as mild joint swelling at DIP joint. Pt frustrated with pain and seems to have decreased understanding of arthritis and that task modifications, joint protection, and bracing may be needed to control pain.     PLAN:  OT FREQUENCY: 1x/week  OT DURATION: 6 weeks through 09/25/2023 as needed and up to 5 total visits  PLANNED INTERVENTIONS: 97168 OT Re-evaluation, 97535 self care/ADL training, 16109 therapeutic exercise, 97530 therapeutic activity, 97112 neuromuscular re-education, 97140 manual therapy, 97039 fluidotherapy, 97010 moist heat, 97010 cryotherapy, 97034 contrast bath, 97760 Orthotics management and training, 60454 Splinting (initial encounter), [id number] Subsequent splinting/medication, compression bandaging, Dry needling, energy conservation, coping strategies training, and  patient/family education  RECOMMENDED OTHER SERVICES: None now  CONSULTED AND AGREED WITH PLAN OF CARE: Patient  PLAN FOR NEXT SESSION:   Check progress towards goals and possible d/c next session. Further education on adaptive strategies and pain management as needed, ? Putty HEP    Velinda Getting, OTR/L, CHT 09/07/2023, 11:07 AM

## 2023-09-10 NOTE — Telephone Encounter (Signed)
Will need to re-run benefits for 2025.

## 2023-09-11 ENCOUNTER — Other Ambulatory Visit (HOSPITAL_BASED_OUTPATIENT_CLINIC_OR_DEPARTMENT_OTHER): Payer: Self-pay

## 2023-09-11 MED ORDER — ZOSTER VAC RECOMB ADJUVANTED 50 MCG/0.5ML IM SUSR
0.5000 mL | Freq: Once | INTRAMUSCULAR | 0 refills | Status: AC
  Filled 2023-09-11: qty 0.5, 1d supply, fill #0

## 2023-09-14 ENCOUNTER — Telehealth: Payer: Self-pay | Admitting: Family Medicine

## 2023-09-14 ENCOUNTER — Other Ambulatory Visit: Payer: Self-pay

## 2023-09-14 DIAGNOSIS — M79641 Pain in right hand: Secondary | ICD-10-CM

## 2023-09-14 NOTE — Telephone Encounter (Signed)
Pt is still having trouble with her finger and is requesting to be referred to Rheumatology. Looks like she had some labs done. Is interested in seeing Dr. Dierdre Forth but is open to his recommendation.  Please call pt to advise where referral is being sent to so she can follow up with them.

## 2023-09-14 NOTE — Telephone Encounter (Signed)
Referral placed. Called and left pt VM informing her.

## 2023-09-15 ENCOUNTER — Encounter: Payer: BC Managed Care – PPO | Admitting: Rehabilitative and Restorative Service Providers"

## 2023-09-17 NOTE — Telephone Encounter (Signed)
 VOB initiated for 2025.

## 2023-09-21 ENCOUNTER — Ambulatory Visit: Payer: BC Managed Care – PPO | Admitting: Orthopaedic Surgery

## 2023-10-05 NOTE — Telephone Encounter (Signed)
Prolia prior auth from printed to complete and fax back to [employer].   [email or web address]

## 2023-10-05 NOTE — Telephone Encounter (Signed)
Undisclosed   

## 2023-10-05 NOTE — Telephone Encounter (Signed)
Prior Authorization REQUIRED for PROLIA  PA PROCESS DETAILS: Prior Authorization is Required. We are unable to confirm if a PA is on file. Please check your records or contact the payer.

## 2023-10-07 NOTE — Telephone Encounter (Signed)
Prior Authorization/pre-determination for [employer] initiated with [employer] via [place] form.

## 2023-10-13 NOTE — Telephone Encounter (Signed)
[employer] Stable   Prior Auth for [facility] APPROVED PA# [id number] Valid: 10/08/23-10/06/24 120 units

## 2023-10-26 DIAGNOSIS — B9689 Other specified bacterial agents as the cause of diseases classified elsewhere: Secondary | ICD-10-CM | POA: Diagnosis not present

## 2023-10-26 DIAGNOSIS — J019 Acute sinusitis, unspecified: Secondary | ICD-10-CM | POA: Diagnosis not present

## 2023-12-04 ENCOUNTER — Other Ambulatory Visit (HOSPITAL_BASED_OUTPATIENT_CLINIC_OR_DEPARTMENT_OTHER): Payer: Self-pay

## 2023-12-04 MED ORDER — ZOSTER VAC RECOMB ADJUVANTED 50 MCG/0.5ML IM SUSR
0.5000 mL | Freq: Once | INTRAMUSCULAR | 0 refills | Status: AC
  Filled 2023-12-04: qty 0.5, 1d supply, fill #0

## 2023-12-15 DIAGNOSIS — H5213 Myopia, bilateral: Secondary | ICD-10-CM | POA: Diagnosis not present

## 2023-12-15 DIAGNOSIS — H31003 Unspecified chorioretinal scars, bilateral: Secondary | ICD-10-CM | POA: Diagnosis not present

## 2023-12-15 DIAGNOSIS — H52203 Unspecified astigmatism, bilateral: Secondary | ICD-10-CM | POA: Diagnosis not present

## 2023-12-15 DIAGNOSIS — J302 Other seasonal allergic rhinitis: Secondary | ICD-10-CM | POA: Diagnosis not present

## 2024-03-28 DIAGNOSIS — Z0000 Encounter for general adult medical examination without abnormal findings: Secondary | ICD-10-CM | POA: Diagnosis not present

## 2024-03-28 DIAGNOSIS — E559 Vitamin D deficiency, unspecified: Secondary | ICD-10-CM | POA: Diagnosis not present

## 2024-03-28 DIAGNOSIS — F419 Anxiety disorder, unspecified: Secondary | ICD-10-CM | POA: Diagnosis not present

## 2024-03-28 DIAGNOSIS — F5101 Primary insomnia: Secondary | ICD-10-CM | POA: Diagnosis not present

## 2024-03-28 DIAGNOSIS — Z13220 Encounter for screening for lipoid disorders: Secondary | ICD-10-CM | POA: Diagnosis not present

## 2024-05-24 DIAGNOSIS — G4489 Other headache syndrome: Secondary | ICD-10-CM | POA: Diagnosis not present

## 2024-05-24 DIAGNOSIS — J069 Acute upper respiratory infection, unspecified: Secondary | ICD-10-CM | POA: Diagnosis not present

## 2024-05-24 DIAGNOSIS — Z20822 Contact with and (suspected) exposure to covid-19: Secondary | ICD-10-CM | POA: Diagnosis not present

## 2024-06-06 DIAGNOSIS — R051 Acute cough: Secondary | ICD-10-CM | POA: Diagnosis not present

## 2024-06-06 DIAGNOSIS — J019 Acute sinusitis, unspecified: Secondary | ICD-10-CM | POA: Diagnosis not present

## 2024-06-20 DIAGNOSIS — Z6827 Body mass index (BMI) 27.0-27.9, adult: Secondary | ICD-10-CM | POA: Diagnosis not present

## 2024-06-20 DIAGNOSIS — Z1151 Encounter for screening for human papillomavirus (HPV): Secondary | ICD-10-CM | POA: Diagnosis not present

## 2024-06-20 DIAGNOSIS — Z1231 Encounter for screening mammogram for malignant neoplasm of breast: Secondary | ICD-10-CM | POA: Diagnosis not present

## 2024-06-20 DIAGNOSIS — Z01419 Encounter for gynecological examination (general) (routine) without abnormal findings: Secondary | ICD-10-CM | POA: Diagnosis not present

## 2024-06-20 DIAGNOSIS — Z124 Encounter for screening for malignant neoplasm of cervix: Secondary | ICD-10-CM | POA: Diagnosis not present

## 2024-06-25 DIAGNOSIS — R059 Cough, unspecified: Secondary | ICD-10-CM | POA: Diagnosis not present

## 2024-06-25 DIAGNOSIS — J019 Acute sinusitis, unspecified: Secondary | ICD-10-CM | POA: Diagnosis not present

## 2024-06-30 DIAGNOSIS — L718 Other rosacea: Secondary | ICD-10-CM | POA: Diagnosis not present

## 2024-06-30 DIAGNOSIS — L578 Other skin changes due to chronic exposure to nonionizing radiation: Secondary | ICD-10-CM | POA: Diagnosis not present

## 2024-06-30 DIAGNOSIS — D1801 Hemangioma of skin and subcutaneous tissue: Secondary | ICD-10-CM | POA: Diagnosis not present

## 2024-06-30 DIAGNOSIS — L821 Other seborrheic keratosis: Secondary | ICD-10-CM | POA: Diagnosis not present

## 2024-07-07 DIAGNOSIS — J309 Allergic rhinitis, unspecified: Secondary | ICD-10-CM | POA: Diagnosis not present

## 2024-07-07 DIAGNOSIS — R053 Chronic cough: Secondary | ICD-10-CM | POA: Diagnosis not present

## 2024-07-07 DIAGNOSIS — K219 Gastro-esophageal reflux disease without esophagitis: Secondary | ICD-10-CM | POA: Diagnosis not present

## 2024-07-07 DIAGNOSIS — Z6828 Body mass index (BMI) 28.0-28.9, adult: Secondary | ICD-10-CM | POA: Diagnosis not present

## 2024-07-18 ENCOUNTER — Encounter: Payer: Self-pay | Admitting: Radiology
